# Patient Record
Sex: Male | Born: 1960 | Race: White | Hispanic: No | Marital: Single | Smoking: Never smoker
Health system: Southern US, Community
[De-identification: ages and names within clinical notes are randomized; demographics above are authoritative.]

## PROBLEM LIST (undated history)

## (undated) DIAGNOSIS — K219 Gastro-esophageal reflux disease without esophagitis: Secondary | ICD-10-CM

## (undated) DIAGNOSIS — F419 Anxiety disorder, unspecified: Secondary | ICD-10-CM

## (undated) DIAGNOSIS — F32A Depression, unspecified: Secondary | ICD-10-CM

## (undated) DIAGNOSIS — I4510 Unspecified right bundle-branch block: Secondary | ICD-10-CM

## (undated) DIAGNOSIS — I10 Essential (primary) hypertension: Secondary | ICD-10-CM

---

## 2011-11-28 ENCOUNTER — Inpatient Hospital Stay (HOSPITAL_COMMUNITY)
Admission: EM | Admit: 2011-11-28 | Discharge: 2011-12-02 | DRG: 439 | Disposition: A | Discharge status: Nursing Home (Includes ICF) | Payer: Self-pay | Attending: Family Medicine | Admitting: Family Medicine

## 2011-11-28 DIAGNOSIS — K859 Acute pancreatitis without necrosis or infection, unspecified: Principal | ICD-10-CM | POA: Diagnosis present

## 2011-11-28 DIAGNOSIS — Z6841 Body Mass Index (BMI) 40.0 and over, adult: Secondary | ICD-10-CM

## 2011-11-28 DIAGNOSIS — D72829 Elevated white blood cell count, unspecified: Secondary | ICD-10-CM | POA: Diagnosis present

## 2011-11-28 DIAGNOSIS — F411 Generalized anxiety disorder: Secondary | ICD-10-CM | POA: Diagnosis present

## 2011-11-28 DIAGNOSIS — E669 Obesity, unspecified: Secondary | ICD-10-CM | POA: Diagnosis present

## 2011-11-28 HISTORY — DX: Anxiety disorder, unspecified: F41.9

## 2011-11-28 HISTORY — DX: Gastro-esophageal reflux disease without esophagitis: K21.9

## 2011-11-28 MED ORDER — MORPHINE SULFATE 4 MG/ML IJ SOLN
4.0000 mg | Freq: Once | INTRAMUSCULAR | Status: AC
  Administered 2011-11-28: 4 mg via INTRAVENOUS
  Filled 2011-11-28: qty 1

## 2011-11-28 MED ORDER — SODIUM CHLORIDE 0.9 % IV BOLUS (SEPSIS)
1000.0000 mL | Freq: Once | INTRAVENOUS | Status: AC
  Administered 2011-11-28: 1000 mL via INTRAVENOUS

## 2011-11-28 MED ORDER — ONDANSETRON HCL 4 MG/2ML IJ SOLN
4.0000 mg | Freq: Once | INTRAMUSCULAR | Status: AC
  Administered 2011-11-28: 4 mg via INTRAVENOUS
  Filled 2011-11-28: qty 2

## 2011-11-28 NOTE — ED Notes (Signed)
Bed:WHALA<BR> Expected date:11/28/11<BR> Expected time:<BR> Means of arrival:<BR> Comments:<BR> EMS 32 Ptar - abd pain

## 2011-11-28 NOTE — ED Notes (Signed)
Pt c/o abd pain. Pt states pain is worse upon eating. Pt also c/o of some diaphoresis.

## 2011-11-28 NOTE — ED Provider Notes (Signed)
History     CSN: [id number]  Arrival date & time 11/28/11  2313   First MD Initiated Contact with Patient 11/28/11 2329      Chief Complaint  Patient presents with  . Abdominal Pain    HPI  History provided by the patient. Patient is a 51 year old male with no significant past medical history who presents with complaints of lower abdominal pains for the past 2 days. Patient reports the pain came on acutely and has been persistent. Pain occasionally waxes and wanes. Pain is worse with some movements and worse after eating. Pain does not radiate. Patient has not taken anything for the pain symptoms. He denies any other aggravating or alleviating factors. Patient denies any other associated symptoms. He denies fever, chills, nausea, vomiting, diarrhea or constipation.    Past Medical History  Diagnosis Date  . Anxiety    Patient sees Dr. Redmond School in [place]   No past surgical history on file.  No family history on file.  History  Substance Use Topics  . Smoking status: Never Smoker   . Smokeless tobacco: Not on file  . Alcohol Use: No      Review of Systems  Constitutional: Positive for diaphoresis and appetite change. Negative for fever and chills.  Respiratory: Negative for cough and shortness of breath.   Cardiovascular: Negative for chest pain.  Gastrointestinal: Positive for abdominal pain. Negative for nausea, vomiting, diarrhea and constipation.  Genitourinary: Negative for dysuria, frequency, hematuria and flank pain.    Allergies  Review of patient's allergies indicates not on file.  Home Medications  No current outpatient prescriptions on file.  There were no vitals taken for this visit.  Physical Exam  Nursing note and vitals reviewed. Constitutional: He is oriented to person, place, and time. He appears well-developed and well-nourished.  HENT:  Head: Normocephalic.  Cardiovascular: Normal rate and regular rhythm.   Pulmonary/Chest: Effort  normal and breath sounds normal.  Abdominal: Soft. He exhibits distension. There is tenderness in the right lower quadrant, suprapubic area and left lower quadrant. There is no rebound, no guarding, no tenderness at McBurney's point and negative Murphy's sign.       Obese. Diffuse lower abdominal tenderness.  Neurological: He is alert and oriented to person, place, and time.  Skin: Skin is warm.  Psychiatric: He has a normal mood and affect. His behavior is normal.    ED Course  Procedures   Results for orders placed during the hospital encounter of 11/28/11  CBC      Component Value Range   WBC 10.0  4.0 - 10.5 (K/uL)   RBC 5.47  4.22 - 5.81 (MIL/uL)   Hemoglobin 16.3  13.0 - 17.0 (g/dL)   HCT 40.9  81.1 - 91.4 (%)   MCV 90.3  78.0 - 100.0 (fL)   MCH 29.8  26.0 - 34.0 (pg)   MCHC 33.0  30.0 - 36.0 (g/dL)   RDW 78.2  95.6 - 21.3 (%)   Platelets 158  150 - 400 (K/uL)  DIFFERENTIAL      Component Value Range   Neutrophils Relative 76  43 - 77 (%)   Lymphocytes Relative 12  12 - 46 (%)   Monocytes Relative 10  3 - 12 (%)   Eosinophils Relative 2  0 - 5 (%)   Basophils Relative 0  0 - 1 (%)   Neutro Abs 7.6  1.7 - 7.7 (K/uL)   Lymphs Abs 1.2  0.7 - 4.0 (K/uL)  Monocytes Absolute 1.0  0.1 - 1.0 (K/uL)   Eosinophils Absolute 0.2  0.0 - 0.7 (K/uL)   Basophils Absolute 0.0  0.0 - 0.1 (K/uL)   Smear Review MORPHOLOGY UNREMARKABLE    COMPREHENSIVE METABOLIC PANEL      Component Value Range   Sodium 140  135 - 145 (mEq/L)   Potassium 3.9  3.5 - 5.1 (mEq/L)   Chloride 106  96 - 112 (mEq/L)   CO2 19  19 - 32 (mEq/L)   Glucose, Bld 129 (*) 70 - 99 (mg/dL)   BUN 16  6 - 23 (mg/dL)   Creatinine, Ser 1.61  0.50 - 1.35 (mg/dL)   Calcium 9.3  8.4 - 09.6 (mg/dL)   Total Protein 6.8  6.0 - 8.3 (g/dL)   Albumin 3.7  3.5 - 5.2 (g/dL)   AST 045 (*) 0 - 37 (U/L)   ALT 145 (*) 0 - 53 (U/L)   Alkaline Phosphatase 116  39 - 117 (U/L)   Total Bilirubin 0.5  0.3 - 1.2 (mg/dL)   GFR calc non Af  Amer >90  >90 (mL/min)   GFR calc Af Amer >90  >90 (mL/min)  LIPASE, BLOOD      Component Value Range   Lipase >3000 (*) 11 - 59 (U/L)       Ct Abdomen Pelvis W Contrast  11/29/2011  *RADIOLOGY REPORT*  Clinical Data: Mid abdominal pain.  Elevated lipase.  White cell count 10.  CT ABDOMEN AND PELVIS WITH CONTRAST  Technique:  Multidetector CT imaging of the abdomen and pelvis was performed following the standard protocol during bolus administration of intravenous contrast.  Contrast: OMNIPAQUE IOHEXOL 300 MG/ML  SOLN  Comparison: None.  Findings: Atelectasis in the lung bases.  Diffuse low attenuation change throughout the liver consistent with diffuse fatty infiltration.  Spleen size is normal.  Gallbladder and bile ducts are normal.  There is infiltration of the peripancreatic fat with fluid layering in the pericolic gutters bilaterally.  Changes are consistent with pancreatitis.  Normal homogeneous pancreatic parenchymal enhancement.  No evidence of focal pancreatic necrosis.  No peripancreatic abscess.  Adrenal glands and kidneys are unremarkable.  Normal caliber abdominal aorta.  No retroperitoneal lymphadenopathy.  The stomach and small bowel are not abnormally distended.  Stool filled colon without distension or wall thickening.  Prominent visceral adipose tissues.  Pelvis: The appendix is normal.  Prostate gland is not enlarged. Bladder wall is not thickened.  No free or loculated pelvic fluid collections.  Diverticula in the sigmoid colon without diverticulitis.  Normal alignment of the lumbar spine.  Hemangioma in L2.  IMPRESSION: Inflammatory infiltration and fluid around the pancreas consistent with pancreatitis.  No evidence of pancreatic necrosis, abscess, or stenosis.  Original Report Authenticated By: Marlon Pel, M.D.     1. Pancreatitis       MDM  Patient seen and evaluated. Patient no acute distress. Patient with slight diaphoresis.   Patient having good  improvement of pain at this time. Lipase is elevated greater than 3000 concerning for pancreatitis. CTs pending.   CT shows signs consistent with uncomplicated pancreatitis. There is no signs for cholecystitis or choledocholithiasis.  Patient discussed with attending physician. Patient with new-onset pancreatitis without specific cause. Patient still with some discomfort. At this time felt patient would benefit most from admission.   Spoke with Dr. Orvan Falconer with [facility]. He will see patient. He would like a lipid panel and abdominal ultrasound ordered. Patient will be admitted to  team [address] [place], [place] 11/29/11 [phone]

## 2011-11-29 ENCOUNTER — Encounter (HOSPITAL_COMMUNITY): Payer: Self-pay | Admitting: Emergency Medicine

## 2011-11-29 ENCOUNTER — Inpatient Hospital Stay (HOSPITAL_COMMUNITY): Payer: Self-pay

## 2011-11-29 ENCOUNTER — Emergency Department (HOSPITAL_COMMUNITY): Payer: Self-pay

## 2011-11-29 DIAGNOSIS — F411 Generalized anxiety disorder: Secondary | ICD-10-CM | POA: Diagnosis present

## 2011-11-29 DIAGNOSIS — K859 Acute pancreatitis without necrosis or infection, unspecified: Principal | ICD-10-CM | POA: Diagnosis present

## 2011-11-29 LAB — CBC
Hemoglobin: 16.3 g/dL (ref 13.0–17.0)
MCH: 29.8 pg (ref 26.0–34.0)
MCHC: 33 g/dL (ref 30.0–36.0)
RDW: 14.3 % (ref 11.5–15.5)

## 2011-11-29 LAB — DIFFERENTIAL
Basophils Absolute: 0 10*3/uL (ref 0.0–0.1)
Basophils Relative: 0 % (ref 0–1)
Eosinophils Absolute: 0.2 10*3/uL (ref 0.0–0.7)
Monocytes Absolute: 1 10*3/uL (ref 0.1–1.0)
Neutro Abs: 7.6 10*3/uL (ref 1.7–7.7)
Neutrophils Relative %: 76 % (ref 43–77)

## 2011-11-29 LAB — LIPID PANEL
HDL: 44 mg/dL (ref >39)
Triglycerides: 67 mg/dL (ref <150)

## 2011-11-29 LAB — COMPREHENSIVE METABOLIC PANEL
BUN: 16 mg/dL (ref 6–23)
Calcium: 9.3 mg/dL (ref 8.4–10.5)
GFR calc Af Amer: >90 mL/min (ref >90)
Glucose, Bld: 129 mg/dL — ABNORMAL HIGH (ref 70–99)
Sodium: 140 mEq/L (ref 135–145)
Total Protein: 6.8 g/dL (ref 6.0–8.3)

## 2011-11-29 LAB — LIPASE, BLOOD: Lipase: >3000 U/L — ABNORMAL HIGH (ref 11–59)

## 2011-11-29 MED ORDER — ONDANSETRON HCL 4 MG/2ML IJ SOLN: 4.0000 mg | Freq: Three times a day (TID) | INTRAMUSCULAR | Status: AC | PRN

## 2011-11-29 MED ORDER — ONDANSETRON HCL 4 MG/2ML IJ SOLN
4.0000 mg | Freq: Once | INTRAMUSCULAR | Status: AC
  Administered 2011-11-29: 4 mg via INTRAVENOUS
  Filled 2011-11-29: qty 2

## 2011-11-29 MED ORDER — HYDROMORPHONE HCL PF 1 MG/ML IJ SOLN: 1.0000 mg | INTRAMUSCULAR | Status: DC | PRN

## 2011-11-29 MED ORDER — LORAZEPAM 0.5 MG PO TABS
0.5000 mg | ORAL_TABLET | Freq: Every day | ORAL | Status: DC | PRN
  Administered 2011-11-30 – 2011-12-02 (×2): 0.5 mg via ORAL
  Filled 2011-11-29 (×3): qty 1

## 2011-11-29 MED ORDER — SODIUM CHLORIDE 0.9 % IJ SOLN: 9.0000 mL | INTRAMUSCULAR | Status: DC | PRN

## 2011-11-29 MED ORDER — ONDANSETRON HCL 4 MG/2ML IJ SOLN: 4.0000 mg | Freq: Four times a day (QID) | INTRAMUSCULAR | Status: DC | PRN

## 2011-11-29 MED ORDER — SODIUM CHLORIDE 0.9 % IV BOLUS (SEPSIS)
1000.0000 mL | Freq: Once | INTRAVENOUS | Status: AC
  Administered 2011-11-29: 1000 mL via INTRAVENOUS

## 2011-11-29 MED ORDER — MORPHINE SULFATE 4 MG/ML IJ SOLN
4.0000 mg | Freq: Once | INTRAMUSCULAR | Status: AC
  Administered 2011-11-29: 4 mg via INTRAVENOUS
  Filled 2011-11-29: qty 1

## 2011-11-29 MED ORDER — IOHEXOL 300 MG/ML  SOLN
100.0000 mL | Freq: Once | INTRAMUSCULAR | Status: AC | PRN
  Administered 2011-11-29: 100 mL via INTRAVENOUS

## 2011-11-29 MED ORDER — DIPHENHYDRAMINE HCL 50 MG/ML IJ SOLN: 12.5000 mg | Freq: Four times a day (QID) | INTRAMUSCULAR | Status: DC | PRN

## 2011-11-29 MED ORDER — ONDANSETRON HCL 4 MG PO TABS: 4.0000 mg | ORAL_TABLET | Freq: Four times a day (QID) | ORAL | Status: DC | PRN

## 2011-11-29 MED ORDER — SODIUM CHLORIDE 0.9 % IV SOLN
INTRAVENOUS | Status: AC
  Administered 2011-11-29: 05:00:00 via INTRAVENOUS

## 2011-11-29 MED ORDER — HYDROCODONE-ACETAMINOPHEN 5-325 MG PO TABS
1.0000 | ORAL_TABLET | ORAL | Status: DC | PRN
  Administered 2011-11-29 – 2011-12-01 (×4): 1 via ORAL
  Filled 2011-11-29 (×4): qty 1

## 2011-11-29 MED ORDER — HEPARIN SODIUM (PORCINE) 5000 UNIT/ML IJ SOLN
5000.0000 [IU] | Freq: Three times a day (TID) | INTRAMUSCULAR | Status: DC
  Administered 2011-11-29 – 2011-12-02 (×8): 5000 [IU] via SUBCUTANEOUS
  Filled 2011-11-29 (×12): qty 1

## 2011-11-29 MED ORDER — ALPRAZOLAM 1 MG PO TABS: 5.0000 mg | ORAL_TABLET | Freq: Every day | ORAL | Status: DC

## 2011-11-29 MED ORDER — NALOXONE HCL 0.4 MG/ML IJ SOLN: 0.4000 mg | INTRAMUSCULAR | Status: DC | PRN

## 2011-11-29 MED ORDER — HYDROMORPHONE HCL PF 1 MG/ML IJ SOLN
1.0000 mg | INTRAMUSCULAR | Status: DC | PRN
  Administered 2011-12-01: 1 mg via INTRAVENOUS
  Filled 2011-11-29: qty 1
  Filled 2011-11-29: qty 2

## 2011-11-29 MED ORDER — KCL IN DEXTROSE-NACL 20-5-0.45 MEQ/L-%-% IV SOLN
INTRAVENOUS | Status: DC
  Administered 2011-11-29 – 2011-12-01 (×6): via INTRAVENOUS
  Filled 2011-11-29 (×7): qty 1000

## 2011-11-29 MED ORDER — DIPHENHYDRAMINE HCL 12.5 MG/5ML PO ELIX: 12.5000 mg | ORAL_SOLUTION | Freq: Four times a day (QID) | ORAL | Status: DC | PRN

## 2011-11-29 NOTE — ED Notes (Signed)
Called to give report nurse unavailable.

## 2011-11-29 NOTE — ED Provider Notes (Signed)
Medical screening examination/treatment/procedure(s) were performed by non-physician practitioner and as supervising physician I was immediately available for consultation/collaboration.  Makyia Erxleben, MD 11/29/11 2300 

## 2011-11-29 NOTE — H&P (Signed)
PCP:  Patient mentions Dr. Redmond School   Chief Complaint:  Acute abdominal discomfort  HPI: Patient is a 51 y/o CM with PMH of anxiety that presented with Abdominal discomfort to the ED. Pain started two days ago.  Was described as an achy pain that did not radiate and was located at the upper portion of his abdomen.  Made worse with po intake.  Nothing that the patient is aware of made it better while he was home.  Denies any fever, chills, emesis, or BRBPR.  Brother is care giver and reports that patient resides at an ALF.  Reportedly patient developed high anxiety after parents who were initial caregivers were not able to care for him further.    While in the ED patient was given morphine, anti emetic (zofran), and CT of abdomen was obtained after patient was found to have an elevated lipase of 3000.  CT of abdomen interpreted as inflammatory infiltration and fluid around pancrease consistent with pancreatitis.  No evidence of pancreatic necrosis, abscess, or stenosis.    U/S of abdomen was subsequently ordered and interpreted as fatty infiltration of the liver with gallstones.  Allergies:   Allergies  Allergen Reactions  . Sulfa Antibiotics Nausea And Vomiting      Past Medical History  Diagnosis Date  . Anxiety     No past surgical history on file.  Prior to Admission medications   Medication Sig Start Date End Date Taking? Authorizing Provider  ALPRAZolam Prudy Feeler) 1 MG tablet Take 5 mg by mouth daily.   Yes Historical Provider, MD  cholecalciferol (VITAMIN D) 1000 UNITS tablet Take 1,000 Units by mouth daily.   Yes Historical Provider, MD  omeprazole (PRILOSEC) 20 MG capsule Take 20 mg by mouth daily.   Yes Historical Provider, MD    Social History:  reports that he has never smoked. He does not have any smokeless tobacco history on file. He reports that he does not drink alcohol. His drug history not on file.  No family history on file.  Review of Systems:  Constitutional:  Denies fever, chills, diaphoresis, appetite change and fatigue.  HEENT: Denies photophobia, eye pain, redness, hearing loss, ear pain, congestion, sore throat, rhinorrhea, sneezing, mouth sores, trouble swallowing, neck pain, neck stiffness and tinnitus.   Respiratory: Denies SOB, DOE, cough, chest tightness,  and wheezing.   Cardiovascular: Denies chest pain, palpitations and leg swelling.  Gastrointestinal: Denies nausea, vomiting, + abdominal pain, denies diarrhea, constipation, blood in stool and abdominal distention.  Genitourinary: Denies dysuria, urgency, frequency, hematuria, flank pain and difficulty urinating.  Musculoskeletal: Denies myalgias, back pain, joint swelling, arthralgias and gait problem.  Skin: Denies pallor, rash and wound.  Neurological: Denies dizziness, seizures, syncope, weakness, light-headedness, numbness and headaches.  Hematological: Denies adenopathy. Easy bruising, personal or family bleeding history  Psychiatric/Behavioral: Denies suicidal ideation, mood changes, confusion, nervousness, sleep disturbance and agitation   Physical Exam: Blood pressure 125/84, pulse 90, temperature 98.3 F (36.8 C), temperature source Oral, resp. rate 20, height 6' (1.829 m), weight 135.4 kg (298 lb 8.1 oz), SpO2 98.00%. General: Alert, awake, oriented x3, in no acute distress. HEENT: No bruits, no goiter. Heart: Regular rate and rhythm, without murmurs, rubs, gallops. Lungs: Clear to auscultation bilaterally. Abdomen: Soft, + epigastric tenderness with no rebound tenderness or guarding, nondistended, positive bowel sounds. Extremities: No clubbing cyanosis or edema with positive pedal pulses. Neuro: Grossly intact, nonfocal.    Labs on Admission:  Results for orders placed during the hospital encounter of  11/28/11 (from the past 48 hour(s))  CBC     Status: Normal   Collection Time   11/29/11 12:04 AM      Component Value Range Comment   WBC 10.0  4.0 - 10.5 (K/uL)     RBC 5.47  4.22 - 5.81 (MIL/uL)    Hemoglobin 16.3  13.0 - 17.0 (g/dL)    HCT 16.1  09.6 - 04.5 (%)    MCV 90.3  78.0 - 100.0 (fL)    MCH 29.8  26.0 - 34.0 (pg)    MCHC 33.0  30.0 - 36.0 (g/dL)    RDW 40.9  81.1 - 91.4 (%)    Platelets 158  150 - 400 (K/uL) PLATELET COUNT CONFIRMED BY SMEAR  DIFFERENTIAL     Status: Normal   Collection Time   11/29/11 12:04 AM      Component Value Range Comment   Neutrophils Relative 76  43 - 77 (%)    Lymphocytes Relative 12  12 - 46 (%)    Monocytes Relative 10  3 - 12 (%)    Eosinophils Relative 2  0 - 5 (%)    Basophils Relative 0  0 - 1 (%)    Neutro Abs 7.6  1.7 - 7.7 (K/uL)    Lymphs Abs 1.2  0.7 - 4.0 (K/uL)    Monocytes Absolute 1.0  0.1 - 1.0 (K/uL)    Eosinophils Absolute 0.2  0.0 - 0.7 (K/uL)    Basophils Absolute 0.0  0.0 - 0.1 (K/uL)    Smear Review MORPHOLOGY UNREMARKABLE     COMPREHENSIVE METABOLIC PANEL     Status: Abnormal   Collection Time   11/29/11 12:04 AM      Component Value Range Comment   Sodium 140  135 - 145 (mEq/L)    Potassium 3.9  3.5 - 5.1 (mEq/L)    Chloride 106  96 - 112 (mEq/L)    CO2 19  19 - 32 (mEq/L)    Glucose, Bld 129 (*) 70 - 99 (mg/dL)    BUN 16  6 - 23 (mg/dL)    Creatinine, Ser 7.82  0.50 - 1.35 (mg/dL)    Calcium 9.3  8.4 - 10.5 (mg/dL)    Total Protein 6.8  6.0 - 8.3 (g/dL)    Albumin 3.7  3.5 - 5.2 (g/dL)    AST 956 (*) 0 - 37 (U/L)    ALT 145 (*) 0 - 53 (U/L)    Alkaline Phosphatase 116  39 - 117 (U/L)    Total Bilirubin 0.5  0.3 - 1.2 (mg/dL)    GFR calc non Af Amer >90  >90 (mL/min)    GFR calc Af Amer >90  >90 (mL/min)   LIPASE, BLOOD     Status: Abnormal   Collection Time   11/29/11 12:04 AM      Component Value Range Comment   Lipase >3000 (*) 11 - 59 (U/L) REPEATED TO VERIFY    Radiological Exams on Admission: US Abdomen Complete  11/29/2011  *RADIOLOGY REPORT*  Clinical Data:  Evaluate for gallstones  ABDOMINAL ULTRASOUND COMPLETE  Comparison:  None.  Findings:  Gallbladder:   Multiple small stones are noted within the lumen of the gallbladder which measure up to 5 mm.  No gallbladder wall thickening identified.  Negative sonographic Murphy's sign.  Common Bile Duct:  Within normal limits in caliber.  Liver: No focal mass lesion identified.  The liver is diffusely echogenic consistent with fatty infiltration.  IVC:  Appears normal.  Pancreas:  No abnormality identified.  Spleen:  Within normal limits in size and echotexture.  Right kidney:  Normal in size and parenchymal echogenicity.  No evidence of mass or hydronephrosis.  Left kidney:  Normal in size and parenchymal echogenicity.  No evidence of mass or hydronephrosis.  Abdominal Aorta:  No aneurysm identified.  IMPRESSION:  1.  Fatty infiltration of the liver. 2.  Gallstones.  Original Report Authenticated By: Rosealee Albee, M.D.   Ct Abdomen Pelvis W Contrast  11/29/2011  *RADIOLOGY REPORT*  Clinical Data: Mid abdominal pain.  Elevated lipase.  White cell count 10.  CT ABDOMEN AND PELVIS WITH CONTRAST  Technique:  Multidetector CT imaging of the abdomen and pelvis was performed following the standard protocol during bolus administration of intravenous contrast.  Contrast: OMNIPAQUE IOHEXOL 300 MG/ML  SOLN  Comparison: None.  Findings: Atelectasis in the lung bases.  Diffuse low attenuation change throughout the liver consistent with diffuse fatty infiltration.  Spleen size is normal.  Gallbladder and bile ducts are normal.  There is infiltration of the peripancreatic fat with fluid layering in the pericolic gutters bilaterally.  Changes are consistent with pancreatitis.  Normal homogeneous pancreatic parenchymal enhancement.  No evidence of focal pancreatic necrosis.  No peripancreatic abscess.  Adrenal glands and kidneys are unremarkable.  Normal caliber abdominal aorta.  No retroperitoneal lymphadenopathy.  The stomach and small bowel are not abnormally distended.  Stool filled colon without distension or wall  thickening.  Prominent visceral adipose tissues.  Pelvis: The appendix is normal.  Prostate gland is not enlarged. Bladder wall is not thickened.  No free or loculated pelvic fluid collections.  Diverticula in the sigmoid colon without diverticulitis.  Normal alignment of the lumbar spine.  Hemangioma in L2.  IMPRESSION: Inflammatory infiltration and fluid around the pancreas consistent with pancreatitis.  No evidence of pancreatic necrosis, abscess, or stenosis.  Original Report Authenticated By: Marlon Pel, M.D.    Assessment/Plan Active Problems:  1) Pancreatitis:  At this point suspect patient may have gall stone pancreatitis given the results from his recent abdominal ultrasound.  Other possible etiologies are secondary to elevated triglycerides or viral infection.   -Place NPO -MIVF's -monitor and trend Lipase levels -first episode reportedly thus will hold off on consulting general surgery at this juncture and will await pending lab results.  2) GAD:  Will continue home regimen at this juncture.  Time Spent on Admission: 45 minutes placing orders, documenting, medical decision making, obtaining history, physical exam  [name] [facility] Pager: [phone] 11/29/2011, 9:10 AM

## 2011-11-29 NOTE — Progress Notes (Signed)
CARE MANAGEMENT NOTE 11/29/2011  Patient:  Jeremy Wu, Jeremy Wu   Account Number:  [id number]  Date Initiated:  11/29/2011  Documentation initiated by:  Elijah Michaelis  Subjective/Objective Assessment:   pt with confirmed pancreatitis by imaging     Action/Plan:   lives at home   Anticipated DC Date:  12/02/2011   Anticipated DC Plan:  HOME/SELF CARE  In-house referral  Financial Counselor         Choice offered to / List presented to:             Status of service:  In process, will continue to follow Medicare Important Message given?   (If response is "NO", the following Medicare IM given date fields will be blank) Date Medicare IM given:   Date Additional Medicare IM given:    Discharge Disposition:    Per UR Regulation:  Reviewed for med. necessity/level of care/duration of stay  If discussed at Long Length of Stay Meetings, dates discussed:    Comments:  [id number]/Laxmi Choung Earlene Plater, RN, BSN, CCM No discharge needs present at time of this review. Case Management [id number]

## 2011-11-30 DIAGNOSIS — F411 Generalized anxiety disorder: Secondary | ICD-10-CM

## 2011-11-30 DIAGNOSIS — K859 Acute pancreatitis without necrosis or infection, unspecified: Secondary | ICD-10-CM

## 2011-11-30 LAB — COMPREHENSIVE METABOLIC PANEL
Albumin: 3.2 g/dL — ABNORMAL LOW (ref 3.5–5.2)
Alkaline Phosphatase: 82 U/L (ref 39–117)
BUN: 11 mg/dL (ref 6–23)
CO2: 24 mEq/L (ref 19–32)
Chloride: 102 mEq/L (ref 96–112)
GFR calc non Af Amer: >90 mL/min (ref >90)
Glucose, Bld: 142 mg/dL — ABNORMAL HIGH (ref 70–99)
Potassium: 4.1 mEq/L (ref 3.5–5.1)
Total Bilirubin: 0.7 mg/dL (ref 0.3–1.2)

## 2011-11-30 LAB — CBC
MCH: 30.1 pg (ref 26.0–34.0)
MCV: 88.2 fL (ref 78.0–100.0)
Platelets: 171 10*3/uL (ref 150–400)
RDW: 14.4 % (ref 11.5–15.5)
WBC: 13.3 10*3/uL — ABNORMAL HIGH (ref 4.0–10.5)

## 2011-11-30 NOTE — Progress Notes (Signed)
Subjective: Patient mentions that he feels better today.  Still having some abdominal discomfort.  No nausea reported today.  Denies any fever, chills, emesis, HA, blurred vision.  Objective: Filed Vitals:   11/29/11 0618 11/29/11 1420 11/29/11 2007 11/30/11 0506  BP: 125/84 135/77 134/72 129/77  Pulse: 90 107 114 109  Temp: 98.3 F (36.8 C) 99.8 F (37.7 C) 100 F (37.8 C) 100.4 F (38 C)  TempSrc: Oral Oral Oral Oral  Resp: 20 19 20 18   Height: 6' (1.829 m)     Weight: 135.4 kg (298 lb 8.1 oz)     SpO2: 98% 96% 95% 96%   Weight change:  No intake or output data in the 24 hours ending 11/30/11 1019  Blood pressure 125/84, pulse 90, temperature 98.3 F (36.8 C), temperature source Oral, resp. rate 20, height 6' (1.829 m), weight 135.4 kg (298 lb 8.1 oz), SpO2 98.00%.  General: Alert, awake, oriented x3, in no acute distress.  HEENT: No bruits, no goiter.  Heart: Regular rate and rhythm, without murmurs, rubs, gallops.  Lungs: Clear to auscultation bilaterally.  Abdomen: Soft, + epigastric tenderness with no rebound tenderness or guarding, nondistended, positive bowel sounds.  Extremities: No clubbing cyanosis or edema with positive pedal pulses.  Neuro: Grossly intact, nonfocal.   Lab Results:  [facility] 11/30/11 0340 11/29/11 0004  NA 136 140  K 4.1 3.9  CL 102 106  CO2 24 19  GLUCOSE 142* 129*  BUN 11 16  CREATININE 0.76 0.79  CALCIUM 8.1* 9.3  MG -- --  PHOS -- --    Basename 11/30/11 0340 11/29/11 0004  AST 49* 131*  ALT 83* 145*  ALKPHOS 82 116  BILITOT 0.7 0.5  PROT 6.1 6.8  ALBUMIN 3.2* 3.7    Basename 11/30/11 0340 11/29/11 0004  LIPASE 560* >3000*  AMYLASE -- --    Basename 11/30/11 0340 11/29/11 0004  WBC 13.3* 10.0  NEUTROABS -- 7.6  HGB 13.8 16.3  HCT 40.5 49.4  MCV 88.2 90.3  PLT 171 158   No results found for this basename: CKTOTAL:3,CKMB:3,CKMBINDEX:3,TROPONINI:3 in the last 72 hours No components found with this basename:  POCBNP:3 No results found for this basename: DDIMER:2 in the last 72 hours No results found for this basename: HGBA1C:2 in the last 72 hours  Basename 11/29/11 0520  CHOL 183  HDL 44  LDLCALC 126*  TRIG 67  CHOLHDL 4.2  LDLDIRECT --   No results found for this basename: TSH,T4TOTAL,FREET3,T3FREE,THYROIDAB in the last 72 hours No results found for this basename: VITAMINB12:2,FOLATE:2,FERRITIN:2,TIBC:2,IRON:2,RETICCTPCT:2 in the last 72 hours  Micro Results: No results found for this or any previous visit (from the past 240 hour(s)).  Studies/Results: US Abdomen Complete  11/29/2011  *RADIOLOGY REPORT*  Clinical Data:  Evaluate for gallstones  ABDOMINAL ULTRASOUND COMPLETE  Comparison:  None.  Findings:  Gallbladder:  Multiple small stones are noted within the lumen of the gallbladder which measure up to 5 mm.  No gallbladder wall thickening identified.  Negative sonographic Murphy's sign.  Common Bile Duct:  Within normal limits in caliber.  Liver: No focal mass lesion identified.  The liver is diffusely echogenic consistent with fatty infiltration.  IVC:  Appears normal.  Pancreas:  No abnormality identified.  Spleen:  Within normal limits in size and echotexture.  Right kidney:  Normal in size and parenchymal echogenicity.  No evidence of mass or hydronephrosis.  Left kidney:  Normal in size and parenchymal echogenicity.  No evidence of mass or  hydronephrosis.  Abdominal Aorta:  No aneurysm identified.  IMPRESSION:  1.  Fatty infiltration of the liver. 2.  Gallstones.  Original Report Authenticated By: Rosealee Albee, M.D.   Ct Abdomen Pelvis W Contrast  11/29/2011  *RADIOLOGY REPORT*  Clinical Data: Mid abdominal pain.  Elevated lipase.  White cell count 10.  CT ABDOMEN AND PELVIS WITH CONTRAST  Technique:  Multidetector CT imaging of the abdomen and pelvis was performed following the standard protocol during bolus administration of intravenous contrast.  Contrast: OMNIPAQUE IOHEXOL  300 MG/ML  SOLN  Comparison: None.  Findings: Atelectasis in the lung bases.  Diffuse low attenuation change throughout the liver consistent with diffuse fatty infiltration.  Spleen size is normal.  Gallbladder and bile ducts are normal.  There is infiltration of the peripancreatic fat with fluid layering in the pericolic gutters bilaterally.  Changes are consistent with pancreatitis.  Normal homogeneous pancreatic parenchymal enhancement.  No evidence of focal pancreatic necrosis.  No peripancreatic abscess.  Adrenal glands and kidneys are unremarkable.  Normal caliber abdominal aorta.  No retroperitoneal lymphadenopathy.  The stomach and small bowel are not abnormally distended.  Stool filled colon without distension or wall thickening.  Prominent visceral adipose tissues.  Pelvis: The appendix is normal.  Prostate gland is not enlarged. Bladder wall is not thickened.  No free or loculated pelvic fluid collections.  Diverticula in the sigmoid colon without diverticulitis.  Normal alignment of the lumbar spine.  Hemangioma in L2.  IMPRESSION: Inflammatory infiltration and fluid around the pancreas consistent with pancreatitis.  No evidence of pancreatic necrosis, abscess, or stenosis.  Original Report Authenticated By: Marlon Pel, M.D.    Medications: I have reviewed the patient's current medications.  Active Problems:  1) Pancreatitis: At this point suspect patient may have gall stone pancreatitis given the results from his recent abdominal ultrasound. His triglyceride level was 67. -advance diet as tolerated.  Will start with clear liquid diet. -MIVF's and will plan on decreasing as his oral intake improves. -monitor and trend Lipase levels  -first episode reportedly thus will hold off on consulting general surgery.  Have discussed with caregiver and have recommended that if patient continues to have pancreatitis patient may consider elective cholecystectomy.     2) GAD: Will continue home  regimen at this juncture.     LOS: 2 days   [name] M.D.  [facility] 11/30/2011, 10:19 AM

## 2011-12-01 LAB — URINALYSIS, ROUTINE W REFLEX MICROSCOPIC
Bilirubin Urine: NEGATIVE
Glucose, UA: 100 mg/dL — AB
Hgb urine dipstick: NEGATIVE
Specific Gravity, Urine: 1.028 (ref 1.005–1.030)
Urobilinogen, UA: 2 mg/dL — ABNORMAL HIGH (ref 0.0–1.0)

## 2011-12-01 LAB — CBC
HCT: 41.1 % (ref 39.0–52.0)
MCHC: 34.1 g/dL (ref 30.0–36.0)
MCV: 87.4 fL (ref 78.0–100.0)
RDW: 13.9 % (ref 11.5–15.5)

## 2011-12-01 LAB — BASIC METABOLIC PANEL
BUN: 9 mg/dL (ref 6–23)
Chloride: 99 mEq/L (ref 96–112)
Creatinine, Ser: 0.78 mg/dL (ref 0.50–1.35)
GFR calc Af Amer: >90 mL/min (ref >90)
GFR calc non Af Amer: >90 mL/min (ref >90)

## 2011-12-01 LAB — URINE MICROSCOPIC-ADD ON

## 2011-12-01 MED ORDER — LEVOFLOXACIN 500 MG PO TABS
500.0000 mg | ORAL_TABLET | Freq: Every day | ORAL | Status: DC
  Administered 2011-12-01 – 2011-12-02 (×2): 500 mg via ORAL
  Filled 2011-12-01 (×2): qty 1

## 2011-12-01 MED ORDER — HYDROCODONE-ACETAMINOPHEN 5-325 MG PO TABS
1.0000 | ORAL_TABLET | ORAL | Status: DC | PRN
  Administered 2011-12-01: 2 via ORAL
  Administered 2011-12-02: 1 via ORAL
  Administered 2011-12-02: 2 via ORAL
  Filled 2011-12-01 (×2): qty 2
  Filled 2011-12-01: qty 1

## 2011-12-01 MED ORDER — SENNA 8.6 MG PO TABS
2.0000 | ORAL_TABLET | Freq: Every evening | ORAL | Status: DC | PRN
  Administered 2011-12-01: 17.2 mg via ORAL
  Filled 2011-12-01: qty 2

## 2011-12-01 NOTE — Progress Notes (Signed)
Subjective: Patient feels better today mentions that he is tolerating his diet well.  Complaining of back pain this afternoon.  Was on a clear liquid diet.  Denies any current abdominal discomfort.  No acute issues reported overnight.  Denies any HA's, nausea, emesis, fever, chills, dysuria.  Objective: Filed Vitals:   11/30/11 0506 11/30/11 1410 11/30/11 2003 12/01/11 0541  BP: 129/77 148/82 147/76 154/88  Pulse: 109 106 107 71  Temp: 100.4 F (38 C) 98.8 F (37.1 C) 100.1 F (37.8 C) 99.1 F (37.3 C)  TempSrc: Oral Oral Oral Oral  Resp: 18 22 19 16   Height:      Weight:      SpO2: 96% 97% 96% 97%   Weight change:   Intake/Output Summary (Last 24 hours) at 12/01/11 1348 Last data filed at 12/01/11 1159  Gross per 24 hour  Intake    780 ml  Output    550 ml  Net    230 ml    General: Alert, awake, oriented x3, in no acute distress.  HEENT: No bruits, no goiter.  Heart: Regular rate and rhythm, without murmurs, rubs, gallops.  Lungs: Clear to auscultation, bilateral air movement.  Abdomen: Soft, + tenderness with deep palpation over epigastric area, nondistended, positive bowel sounds.  Neuro: Grossly intact, nonfocal.   Lab Results:  [facility] 12/01/11 0925 11/30/11 0340  NA 135 136  K 4.0 4.1  CL 99 102  CO2 26 24  GLUCOSE 187* 142*  BUN 9 11  CREATININE 0.78 0.76  CALCIUM 8.5 8.1*  MG -- --  PHOS -- --    Basename 11/30/11 0340 11/29/11 0004  AST 49* 131*  ALT 83* 145*  ALKPHOS 82 116  BILITOT 0.7 0.5  PROT 6.1 6.8  ALBUMIN 3.2* 3.7    Basename 12/01/11 0925 11/30/11 0340  LIPASE 59 560*  AMYLASE -- --    Jeremy Wu 12/01/11 0925 11/30/11 0340 11/29/11 0004  WBC 15.1* 13.3* --  NEUTROABS -- -- 7.6  HGB 14.0 13.8 --  HCT 41.1 40.5 --  MCV 87.4 88.2 --  PLT 186 171 --   No results found for this basename: CKTOTAL:3,CKMB:3,CKMBINDEX:3,TROPONINI:3 in the last 72 hours No components found with this basename: POCBNP:3 No results found for this  basename: DDIMER:2 in the last 72 hours No results found for this basename: HGBA1C:2 in the last 72 hours  Basename 11/29/11 0520  CHOL 183  HDL 44  LDLCALC 126*  TRIG 67  CHOLHDL 4.2  LDLDIRECT --   No results found for this basename: TSH,T4TOTAL,FREET3,T3FREE,THYROIDAB in the last 72 hours No results found for this basename: VITAMINB12:2,FOLATE:2,FERRITIN:2,TIBC:2,IRON:2,RETICCTPCT:2 in the last 72 hours  Micro Results: No results found for this or any previous visit (from the past 240 hour(s)).  Studies/Results: No results found.  Medications: I have reviewed the patient's current medications.  1) Pancreatitis: At this point suspect patient may have gall stone pancreatitis given the results from his recent abdominal ultrasound. His triglyceride level was 67.  -advance diet as tolerated. Low fat diet today  -Will d/c MIVF -monitor and trend Lipase levels.  Currently within normal limits 59. -first episode reportedly thus will hold off on consulting general surgery. Have discussed with caregiver and have recommended that if patient continues to have pancreatitis patient may consider elective cholecystectomy.  - D/c dilaudid given improvement in abdominal discomfort.  2) GAD: Will continue home regimen at this juncture.      LOS: 3 days   [name] M.D.  [facility] 12/01/2011, 1:48 PM

## 2011-12-01 NOTE — Progress Notes (Signed)
CSW received referral to help with placement options. Pt is from [place]. CSW consulted with [facility]. Pt can D/C at any time when medically ready.

## 2011-12-02 DIAGNOSIS — F411 Generalized anxiety disorder: Secondary | ICD-10-CM

## 2011-12-02 DIAGNOSIS — K859 Acute pancreatitis without necrosis or infection, unspecified: Secondary | ICD-10-CM

## 2011-12-02 LAB — BASIC METABOLIC PANEL
BUN: 11 mg/dL (ref 6–23)
Chloride: 93 mEq/L — ABNORMAL LOW (ref 96–112)
GFR calc Af Amer: >90 mL/min (ref >90)
Potassium: 3.6 mEq/L (ref 3.5–5.1)
Sodium: 131 mEq/L — ABNORMAL LOW (ref 135–145)

## 2011-12-02 LAB — CBC
HCT: 40.7 % (ref 39.0–52.0)
Hemoglobin: 14.2 g/dL (ref 13.0–17.0)
MCHC: 34.9 g/dL (ref 30.0–36.0)
RBC: 4.74 MIL/uL (ref 4.22–5.81)
WBC: 12.4 10*3/uL — ABNORMAL HIGH (ref 4.0–10.5)

## 2011-12-02 MED ORDER — LEVOFLOXACIN 500 MG PO TABS: 500.0000 mg | ORAL_TABLET | Freq: Every day | ORAL | Status: AC

## 2011-12-02 NOTE — Discharge Summary (Signed)
Physician Discharge Summary  Jeremy Wu:[id number] DOB: 1961/04/18 DOA: 11/28/2011  PCP: Provider Not In System  Admit date: 11/28/2011 Discharge date: 12/02/2011  Discharge Diagnoses:  Active Problems:  Acute pancreatitis  Generalized anxiety disorder   Discharge Condition: Stable  Disposition:   History of present illness:  From original HPI: Patient is a 51 y/o CM with PMH of anxiety that presented with Abdominal discomfort to the ED. Pain started two days ago. Was described as an achy pain that did not radiate and was located at the upper portion of his abdomen. Made worse with po intake. Nothing that the patient is aware of made it better while he was home. Denies any fever, chills, emesis, or BRBPR. Brother is care giver and reports that patient resides at an ALF. Reportedly patient developed high anxiety after parents who were initial caregivers were not able to care for him further.  While in the ED patient was given morphine, anti emetic (zofran), and CT of abdomen was obtained after patient was found to have an elevated lipase of 3000. CT of abdomen interpreted as inflammatory infiltration and fluid around pancrease consistent with pancreatitis. No evidence of pancreatic necrosis, abscess, or stenosis.  U/S of abdomen was subsequently ordered and interpreted as fatty infiltration of the liver with gallstones.   Hospital Course:  Patient was placed on IVF and made NPO.  His lipase trended down on this regimen and his abdominal discomfort subsided.  He was able to be transitioned to low fat diet and his IVF was eventually discontinued.  Patient currently denies any abominal discomfort and will be transitioned to his ALF facility today.  1) Pancreatitis: At this point suspect patient may have gall stone pancreatitis given the results from his recent abdominal ultrasound.  -Patient will be transitioned to home with recommended low fat diet.  -first episode reportedly thus  will hold off on consulting general surgery at this juncture.   2) GAD: Will continue home regimen at this juncture  3) Leukocytosis:  Likely secondary to pancreatitis.  Index of suspicion is low for active infection.  But at this juncture will discharge on levaquin for short course of antibiotics 5 days total.   Discharge Exam: Filed Vitals:   12/02/11 0656  BP: 156/90  Pulse: 102  Temp: 98.4 F (36.9 C)  Resp: 20   Filed Vitals:   12/01/11 0541 12/01/11 1428 12/01/11 2134 12/02/11 0656  BP: 154/88 157/85 142/80 156/90  Pulse: 71 90 85 102  Temp: 99.1 F (37.3 C) 99.3 F (37.4 C) 98.8 F (37.1 C) 98.4 F (36.9 C)  TempSrc: Oral Oral Oral Temporal  Resp: 16 20 16 20   Height:      Weight:      SpO2: 97% 96% 98% 97%   General: Alert, awake, oriented x3, in no acute distress. HEENT: No bruits, no goiter. Heart: Regular rate and rhythm, without murmurs, rubs, gallops. Lungs: Clear to auscultation bilaterally. No wheezes Abdomen: Soft, nontender, nondistended, positive bowel sounds. Extremities: No clubbing cyanosis or edema with positive pedal pulses. Neuro: Grossly intact, nonfocal.   Discharge Instructions  Discharge Orders    Future Orders Please Complete By Expires   Diet - low sodium heart healthy      Increase activity slowly      Discharge instructions      Comments:   Please take medication as indicated.  Follow up with your primary care physician in 1-2 weeks or sooner should any new concerns arise.  Call MD for:  temperature >100.4      Call MD for:  redness, tenderness, or signs of infection (pain, swelling, redness, odor or green/yellow discharge around incision site)      Call MD for:  persistant nausea and vomiting        Medication List  As of 12/02/2011 12:21 PM   TAKE these medications         cholecalciferol 1000 UNITS tablet   Commonly known as: VITAMIN D   Take 1,000 Units by mouth daily.      levofloxacin 500 MG tablet   Commonly known as:  LEVAQUIN   Take 1 tablet (500 mg total) by mouth daily.      LORazepam 0.5 MG tablet   Commonly known as: ATIVAN   Take 0.5 mg by mouth daily as needed.      omeprazole 20 MG capsule   Commonly known as: PRILOSEC   Take 20 mg by mouth daily.              The results of significant diagnostics from this hospitalization (including imaging, microbiology, ancillary and laboratory) are listed below for reference.    Significant Diagnostic Studies: US Abdomen Complete  11/29/2011  *RADIOLOGY REPORT*  Clinical Data:  Evaluate for gallstones  ABDOMINAL ULTRASOUND COMPLETE  Comparison:  None.  Findings:  Gallbladder:  Multiple small stones are noted within the lumen of the gallbladder which measure up to 5 mm.  No gallbladder wall thickening identified.  Negative sonographic Murphy's sign.  Common Bile Duct:  Within normal limits in caliber.  Liver: No focal mass lesion identified.  The liver is diffusely echogenic consistent with fatty infiltration.  IVC:  Appears normal.  Pancreas:  No abnormality identified.  Spleen:  Within normal limits in size and echotexture.  Right kidney:  Normal in size and parenchymal echogenicity.  No evidence of mass or hydronephrosis.  Left kidney:  Normal in size and parenchymal echogenicity.  No evidence of mass or hydronephrosis.  Abdominal Aorta:  No aneurysm identified.  IMPRESSION:  1.  Fatty infiltration of the liver. 2.  Gallstones.  Original Report Authenticated By: Rosealee Albee, M.D.   Ct Abdomen Pelvis W Contrast  11/29/2011  *RADIOLOGY REPORT*  Clinical Data: Mid abdominal pain.  Elevated lipase.  White cell count 10.  CT ABDOMEN AND PELVIS WITH CONTRAST  Technique:  Multidetector CT imaging of the abdomen and pelvis was performed following the standard protocol during bolus administration of intravenous contrast.  Contrast: OMNIPAQUE IOHEXOL 300 MG/ML  SOLN  Comparison: None.  Findings: Atelectasis in the lung bases.  Diffuse low attenuation  change throughout the liver consistent with diffuse fatty infiltration.  Spleen size is normal.  Gallbladder and bile ducts are normal.  There is infiltration of the peripancreatic fat with fluid layering in the pericolic gutters bilaterally.  Changes are consistent with pancreatitis.  Normal homogeneous pancreatic parenchymal enhancement.  No evidence of focal pancreatic necrosis.  No peripancreatic abscess.  Adrenal glands and kidneys are unremarkable.  Normal caliber abdominal aorta.  No retroperitoneal lymphadenopathy.  The stomach and small bowel are not abnormally distended.  Stool filled colon without distension or wall thickening.  Prominent visceral adipose tissues.  Pelvis: The appendix is normal.  Prostate gland is not enlarged. Bladder wall is not thickened.  No free or loculated pelvic fluid collections.  Diverticula in the sigmoid colon without diverticulitis.  Normal alignment of the lumbar spine.  Hemangioma in L2.  IMPRESSION: Inflammatory infiltration and  fluid around the pancreas consistent with pancreatitis.  No evidence of pancreatic necrosis, abscess, or stenosis.  Original Report Authenticated By: Marlon Pel, M.D.    Microbiology: No results found for this or any previous visit (from the past 240 hour(s)).   Labs: Basic Metabolic Panel:  Lab 12/02/11 1610 12/01/11 0925 11/30/11 0340 11/29/11 0004  NA 131* 135 136 140  K 3.6 4.0 -- --  CL 93* 99 102 106  CO2 26 26 24 19   GLUCOSE 178* 187* 142* 129*  BUN 11 9 11 16   CREATININE 0.69 0.78 0.76 0.79  CALCIUM 8.8 8.5 8.1* 9.3  MG -- -- -- --  PHOS -- -- -- --   Liver Function Tests:  Lab 11/30/11 0340 11/29/11 0004  AST 49* 131*  ALT 83* 145*  ALKPHOS 82 116  BILITOT 0.7 0.5  PROT 6.1 6.8  ALBUMIN 3.2* 3.7    Lab 12/01/11 0925 11/30/11 0340 11/29/11 0004  LIPASE 59 560* >3000*  AMYLASE -- -- --   No results found for this basename: AMMONIA:5 in the last 168 hours CBC:  Lab 12/02/11 0840 12/01/11 0925  11/30/11 0340 11/29/11 0004  WBC 12.4* 15.1* 13.3* 10.0  NEUTROABS -- -- -- 7.6  HGB 14.2 14.0 13.8 16.3  HCT 40.7 41.1 40.5 49.4  MCV 85.9 87.4 88.2 90.3  PLT 201 186 171 158   Cardiac Enzymes: No results found for this basename: CKTOTAL:5,CKMB:5,CKMBINDEX:5,TROPONINI:5 in the last 168 hours BNP: No components found with this basename: POCBNP:5 CBG: No results found for this basename: GLUCAP:5 in the last 168 hours  Time coordinating discharge: > 35 minutes discussing with patient, family, nursing, placing orders, medical decision making, documenting, examining patient, documenting, billing, updating information services.  Signed:  [name]  [facility] 12/02/2011, 12:21 PM

## 2011-12-02 NOTE — Progress Notes (Signed)
CSW was contacted regarding Jeremy Wu back to ALF at [facility].  CSW spoke with ALF facility to confirm Wu plans.  MD has contacted Jeremy's brother Jeremy Wu) concerning Wu plans and brother will be transporting Jeremy back to facility.   Prescriptions left in the wall chart for Brother.   Nurse will call brother when Jeremy ready.  Leron Croak, LCSWA [employer] [phone]

## 2011-12-02 NOTE — Progress Notes (Signed)
DC to ALF. Transport by brother Ed. DC instructions & Levaquin script reviewed & given to brother. No change from AM assessment.Hartley Barefoot

## 2016-06-09 ENCOUNTER — Emergency Department (HOSPITAL_COMMUNITY)
Admission: EM | Admit: 2016-06-09 | Discharge: 2016-06-10 | Disposition: A | Discharge status: Home / Self Care | Payer: BLUE CROSS/BLUE SHIELD | Attending: Emergency Medicine | Admitting: Emergency Medicine

## 2016-06-09 ENCOUNTER — Emergency Department (HOSPITAL_COMMUNITY): Payer: Self-pay

## 2016-06-09 ENCOUNTER — Encounter (HOSPITAL_COMMUNITY): Payer: Self-pay

## 2016-06-09 DIAGNOSIS — R630 Anorexia: Secondary | ICD-10-CM

## 2016-06-09 DIAGNOSIS — R4182 Altered mental status, unspecified: Secondary | ICD-10-CM | POA: Insufficient documentation

## 2016-06-09 DIAGNOSIS — F332 Major depressive disorder, recurrent severe without psychotic features: Secondary | ICD-10-CM | POA: Diagnosis present

## 2016-06-09 DIAGNOSIS — F419 Anxiety disorder, unspecified: Secondary | ICD-10-CM | POA: Insufficient documentation

## 2016-06-09 DIAGNOSIS — F99 Mental disorder, not otherwise specified: Secondary | ICD-10-CM

## 2016-06-09 DIAGNOSIS — Z79899 Other long term (current) drug therapy: Secondary | ICD-10-CM | POA: Insufficient documentation

## 2016-06-09 LAB — ACETAMINOPHEN LEVEL

## 2016-06-09 LAB — URINALYSIS, ROUTINE W REFLEX MICROSCOPIC
Bilirubin Urine: NEGATIVE
Glucose, UA: NEGATIVE mg/dL
Hgb urine dipstick: NEGATIVE
KETONES UR: 20 mg/dL — AB
LEUKOCYTES UA: NEGATIVE
NITRITE: NEGATIVE
PH: 5 (ref 5.0–8.0)
Protein, ur: NEGATIVE mg/dL
SPECIFIC GRAVITY, URINE: 1.027 (ref 1.005–1.030)

## 2016-06-09 LAB — CBC WITH DIFFERENTIAL/PLATELET
Basophils Absolute: 0 10*3/uL (ref 0.0–0.1)
Basophils Relative: 0 %
EOS ABS: 0.1 10*3/uL (ref 0.0–0.7)
EOS PCT: 1 %
HCT: 38.9 % — ABNORMAL LOW (ref 39.0–52.0)
Hemoglobin: 13.7 g/dL (ref 13.0–17.0)
LYMPHS ABS: 2.6 10*3/uL (ref 0.7–4.0)
Lymphocytes Relative: 40 %
MCH: 30.8 pg (ref 26.0–34.0)
MCHC: 35.2 g/dL (ref 30.0–36.0)
MCV: 87.4 fL (ref 78.0–100.0)
MONO ABS: 0.7 10*3/uL (ref 0.1–1.0)
MONOS PCT: 10 %
Neutro Abs: 3.2 10*3/uL (ref 1.7–7.7)
Neutrophils Relative %: 49 %
PLATELETS: 220 10*3/uL (ref 150–400)
RBC: 4.45 MIL/uL (ref 4.22–5.81)
RDW: 13.7 % (ref 11.5–15.5)
WBC: 6.5 10*3/uL (ref 4.0–10.5)

## 2016-06-09 LAB — ETHANOL: Alcohol, Ethyl (B): <5 mg/dL (ref <5)

## 2016-06-09 LAB — COMPREHENSIVE METABOLIC PANEL
ALT: 24 U/L (ref 17–63)
ANION GAP: 10 (ref 5–15)
AST: 33 U/L (ref 15–41)
Albumin: 4.3 g/dL (ref 3.5–5.0)
Alkaline Phosphatase: 60 U/L (ref 38–126)
BUN: 28 mg/dL — ABNORMAL HIGH (ref 6–20)
CHLORIDE: 101 mmol/L (ref 101–111)
CO2: 27 mmol/L (ref 22–32)
Calcium: 9.7 mg/dL (ref 8.9–10.3)
Creatinine, Ser: 1.18 mg/dL (ref 0.61–1.24)
Glucose, Bld: 105 mg/dL — ABNORMAL HIGH (ref 65–99)
Potassium: 4 mmol/L (ref 3.5–5.1)
SODIUM: 138 mmol/L (ref 135–145)
Total Bilirubin: 1 mg/dL (ref 0.3–1.2)
Total Protein: 7 g/dL (ref 6.5–8.1)

## 2016-06-09 LAB — RAPID URINE DRUG SCREEN, HOSP PERFORMED
Amphetamines: NOT DETECTED
BARBITURATES: NOT DETECTED
Benzodiazepines: NOT DETECTED
COCAINE: NOT DETECTED
OPIATES: NOT DETECTED
TETRAHYDROCANNABINOL: NOT DETECTED

## 2016-06-09 LAB — SALICYLATE LEVEL

## 2016-06-09 LAB — TSH: TSH: 1.486 u[IU]/mL (ref 0.350–4.500)

## 2016-06-09 MED ORDER — SODIUM CHLORIDE 0.9 % IV BOLUS (SEPSIS)
1000.0000 mL | Freq: Once | INTRAVENOUS | Status: AC
  Administered 2016-06-09: 1000 mL via INTRAVENOUS

## 2016-06-09 NOTE — ED Triage Notes (Signed)
Pt. Lives in [place] and Okey DupreRose is his caregiver and his brother is also present which is POA and they report that pt. Is not eating or walking.  He is unable to take care of himself.  They would like a psychiatric evaluation completed.   Pt. Denies any pain or discomfort.    Skin is warm and dry and pink.    Pt. Is alert and oriented X4.

## 2016-06-09 NOTE — BH Assessment (Signed)
Tele Assessment Note   Jeremy Wu is an 55 y.o. male who presents to the ED with his brother, Burnard Leighd Mccluney. Pt was unresponsive during the assessment and when asked why he was in the hospital pt began to speak, then suddenly stopped and looked into the distance. Pt's brother then began to explain the presenting problem. Pt's brother reports that the pt has recently regressed, began wearing diapers about 1 year ago and at times is unresponsive to others. Pt's brother reports the pt has been displaying paranoid delusions and "thinking people are after him or the police are coming to get him." Pt's brother reports the pt began to detoriate after his parents health got worse and began to decline. Pt's brother reports the pt lived with their parents all of his life and when his mother died 10 years ago, the pt "had a mental breakdown."   Pt's brother reports the pt sometimes says he cannot walk, refuses to eat, and will stare mindlessly. Pt's brother reports yesterday the pt was at [employer] with his caregivers eating and "everything was fine." Pt's brother reports the pt goes through phases where he is "fine" and other times when the pt is completely catatonic. Pt's brother reports the pt is taking medication but feels it is not working. Pt's brother reports the pt sometimes "rubs and scratches his arms" in a nervous or anxious manner and "shakes at night as if he is having a seizure."   Per Nira ConnJason Berry, FNP pt will need an AM psych eval. Shanda BumpsJessica, RN has been notified of the recommendation.   Diagnosis: Deferred  Past Medical History:  Past Medical History:  Diagnosis Date   Anxiety    GERD (gastroesophageal reflux disease)     History reviewed. No pertinent surgical history.  Family History: No family history on file.  Social History:  reports that he has never smoked. He has never used smokeless tobacco. He reports that he does not drink alcohol or use drugs.  Additional Social History:   Alcohol / Drug Use Pain Medications: Pt denies abuse  Prescriptions: Pt denies abuse  Over the Counter: Pt denies abuse  History of alcohol / drug use?: No history of alcohol / drug abuse  CIWA: CIWA-Ar BP: 109/58 Pulse Rate: 73 COWS:    PATIENT STRENGTHS: (choose at least two) Physical Health Supportive family/friends  Allergies:  Allergies  Allergen Reactions   Sulfa Antibiotics Nausea And Vomiting    Home Medications:  (Not in a hospital admission)  OB/GYN Status:  No LMP for male patient.  General Assessment Data [facility] Assessment: In system Is this a Tele or Face-to-Face Assessment?: Tele Assessment Is this an Initial Assessment or a Re-assessment for this encounter?: Initial Assessment Marital status: Single Is patient pregnant?: No Pregnancy Status: No Living Arrangements: Group Home ([facility]) Can pt return to current living arrangement?: Yes Admission Status: Voluntary Is patient capable of signing voluntary admission?: Yes Referral Source: Self/Family/Friend Insurance type: none     Crisis Care Plan Living Arrangements: Group Home ([facility]) Name of Psychiatrist: none current Name of Therapist: none current  Education Status Is patient currently in school?: No Highest grade of school patient has completed: some college  Risk to self with the past 6 months Suicidal Ideation: No Has patient been a risk to self within the past 6 months prior to admission? : No Suicidal Intent: No Has patient had any suicidal intent within the past 6 months  prior to admission? : No Is patient at risk for suicide?: No Suicidal Plan?: No Has patient had any suicidal plan within the past 6 months prior to admission? : No Access to Means: No What has been your use of drugs/alcohol within the last 12 months?: denies Previous Attempts/Gestures: No Intentional Self Injurious Behavior: None Family Suicide  History: No Recent stressful life event(s): Other (Comment) (none reported) Persecutory voices/beliefs?: No Depression: No Substance abuse history and/or treatment for substance abuse?: No Suicide prevention information given to non-admitted patients: Not applicable  Risk to Others within the past 6 months Homicidal Ideation: No Does patient have any lifetime risk of violence toward others beyond the six months prior to admission? : No Thoughts of Harm to Others: No Current Homicidal Intent: No Current Homicidal Plan: No Access to Homicidal Means: No History of harm to others?: No Assessment of Violence: None Noted Does patient have access to weapons?: No Criminal Charges Pending?: No Does patient have a court date: No Is patient on probation?: No  Psychosis Hallucinations: None noted Delusions: Persecutory  Mental Status Report Appearance/Hygiene: In scrubs Eye Contact: Fair Motor Activity: Unable to assess Speech: Unable to assess Level of Consciousness: Quiet/awake, Unresponsive (To pain or command) (unresponsive at times during the assessment ) Mood: Other (Comment) (pt presented to be partially catatonic) Affect: Constricted Anxiety Level: None Thought Processes: Thought Blocking Judgement: Partial Orientation: Not oriented Obsessive Compulsive Thoughts/Behaviors: None  Cognitive Functioning Concentration: Fair Memory: Recent Intact, Remote Intact IQ: Average Insight: Fair Impulse Control: Fair Appetite: Poor Sleep: Decreased Total Hours of Sleep: 5 Vegetative Symptoms: None  ADLScreening [facility] Assessment Services) Patient's cognitive ability adequate to safely complete daily activities?: No Patient able to express need for assistance with ADLs?: No Independently performs ADLs?: No (pt reports recent changes in his ability to handle daily tasks)  Prior Inpatient Therapy Prior Inpatient Therapy: Yes Prior Therapy Dates: 2007 Prior Therapy  Facilty/Provider(s): [place], [place] Reason for Treatment: Pt's brother reports the pt "had a mental breakdown when his mother died"  Prior Outpatient Therapy Prior Outpatient Therapy: Yes Prior Therapy Dates: unknown Prior Therapy Facilty/Provider(s): [facility] Reason for Treatment: med management Does patient have an ACCT team?: No Does patient have Intensive In-House Services?  : No Does patient have [facility] services? : No Does patient have P4CC services?: No  ADL Screening (condition at time of admission) Patient's cognitive ability adequate to safely complete daily activities?: No Is the patient deaf or have difficulty hearing?: No Does the patient have difficulty seeing, even when wearing glasses/contacts?: No Does the patient have difficulty concentrating, remembering, or making decisions?: Yes Patient able to express need for assistance with ADLs?: No Does the patient have difficulty dressing or bathing?: Yes Independently performs ADLs?: No (pt reports recent changes in his ability to handle daily tasks) Does the patient have difficulty walking or climbing stairs?: Yes Weakness of Legs: Both (pt's brother reports the pt has recently regressed and sometimes acts as if he cannot walk) Weakness of Arms/Hands: None  Home Assistive Devices/Equipment Home Assistive Devices/Equipment: None    Abuse/Neglect Assessment (Assessment to be complete while patient is alone) Physical Abuse: Denies Verbal Abuse: Denies Sexual Abuse: Denies Exploitation of patient/patient's resources: Denies Self-Neglect: Denies     Merchant navy officerAdvance Directives (For Healthcare) Does Patient Have a Medical Advance Directive?: Yes Type of Advance Directive: Healthcare Power of Attorney Copy of Healthcare Power of Attorney in Chart?: No - copy requested Would patient like information on creating a medical  advance directive?: No - Patient declined    Additional Information 1:1 In  Past 12 Months?: No CIRT Risk: No Elopement Risk: No Does patient have medical clearance?: Yes     Disposition:  Disposition Initial Assessment Completed for this Encounter: Yes Disposition of Patient: Other dispositions Other disposition(s): Other (Comment) (AM psych eval per Nira Conn, FNP )  Karolee Ohs 06/09/2016 10:23 PM

## 2016-06-09 NOTE — ED Provider Notes (Signed)
[facility]-EMERGENCY DEPT Provider Note   CSN: [id number] Arrival date & time: 06/09/16  1349     History   Chief Complaint Chief Complaint  Patient presents with  . Medical Clearance    HPI Jeremy Wu is a 55 y.o. male.  HPI 55 year old male who presents for medical clearance. He has a history of cognitive impairment, lives in [place] and has a caregiver. His brother is his POA, and I knows him very well. States that over the past week, he has had decreased appetite not wanting to eat or drink. Family has noted that he has become extremely anxious at times, and often behaving inappropriately where he seems like he almost forgets how to do daily activities such as walking or eating. He has had periods of time where he seems unresponsive to people talking to him. Patient brother states that this is similar to several years ago when he had a mental breakdown requiring inpatient hospitalization. He is on multiple psychiatric medications, I managed by the PCP, and his brother is concerned that he is unable to manage his psychiatric condition. He has not had fever, cough, difficulty breathing, nausea or vomiting, diarrhea, abdominal pain or chest pain. Past Medical History:  Diagnosis Date  . Anxiety   . GERD (gastroesophageal reflux disease)     Patient Active Problem List   Diagnosis Date Noted  . Acute pancreatitis 11/29/2011  . Generalized anxiety disorder 11/29/2011    History reviewed. No pertinent surgical history.     Home Medications    Prior to Admission medications   Medication Sig Start Date End Date Taking? Authorizing Provider  cholecalciferol (VITAMIN D) 1000 UNITS tablet Take 1,000 Units by mouth daily.   Yes Historical Provider, MD  divalproex (DEPAKOTE) 125 MG DR tablet Take 125 mg by mouth 2 (two) times daily. 04/17/16  Yes Historical Provider, MD  lisinopril (PRINIVIL,ZESTRIL) 10 MG tablet Take 10 mg by mouth daily. 05/16/16  Yes Historical Provider,  MD  LORazepam (ATIVAN) 0.5 MG tablet Take 0.5 mg by mouth at bedtime.    Yes Historical Provider, MD  metFORMIN (GLUCOPHAGE) 500 MG tablet Take 500 mg by mouth daily. 05/16/16  Yes Historical Provider, MD  OLANZapine (ZYPREXA) 5 MG tablet Take 5 mg by mouth at bedtime. 05/16/16  Yes Historical Provider, MD  oxybutynin (DITROPAN-XL) 10 MG 24 hr tablet Take 10 mg by mouth daily. 05/16/16  Yes Historical Provider, MD  PARoxetine (PAXIL) 20 MG tablet Take 20 mg by mouth at bedtime. 05/16/16  Yes Historical Provider, MD  QUEtiapine (SEROQUEL) 50 MG tablet Take 50 mg by mouth at bedtime. 05/16/16  Yes Historical Provider, MD  triamterene-hydrochlorothiazide (MAXZIDE-25) 37.5-25 MG tablet Take 0.5 tablets by mouth 3 (three) times daily. 05/16/16  Yes Historical Provider, MD  valproic acid (DEPAKENE) 250 MG capsule Take 250 mg by mouth 2 (two) times daily. 06/09/16  Yes Historical Provider, MD  omeprazole (PRILOSEC) 20 MG capsule Take 20 mg by mouth daily.    Historical Provider, MD    Family History No family history on file.  Social History Social History  Substance Use Topics  . Smoking status: Never Smoker  . Smokeless tobacco: Never Used  . Alcohol use No     Allergies   Sulfa antibiotics   Review of Systems Review of Systems 10/14 systems reviewed and are negative other than those stated in the HPI   Physical Exam Updated Vital Signs BP 119/66 (BP Location: Right Arm)   Pulse 86  Temp 97.8 F (36.6 C) (Oral)   Resp 14   Ht 6' (1.829 m)   Wt 189 lb 2 oz (85.8 kg)   SpO2 97%   BMI 25.65 kg/m   Physical Exam Physical Exam  Nursing note and vitals reviewed. Constitutional: Well developed, well nourished, non-toxic, and in no acute distress Head: Normocephalic and atraumatic.  Mouth/Throat: Oropharynx is clear and dry mucous membranes.  Neck: Normal range of motion. Neck supple.  Cardiovascular: Normal rate and regular rhythm.   Pulmonary/Chest: Effort normal and breath  sounds normal.  Abdominal: Soft. There is no tenderness. There is no rebound and no guarding.  Musculoskeletal: Normal range of motion.  Neurological: Alert, no facial droop, fluent speech, moves all extremities symmetrically Skin: Skin is warm and dry.  Psychiatric: Cooperative   ED Treatments / Results  Labs (all labs ordered are listed, but only abnormal results are displayed) Labs Reviewed  CBC WITH DIFFERENTIAL/PLATELET - Abnormal; Notable for the following:       Result Value   HCT 38.9 (*)    All other components within normal limits  COMPREHENSIVE METABOLIC PANEL - Abnormal; Notable for the following:    Glucose, Bld 105 (*)    BUN 28 (*)    All other components within normal limits  URINALYSIS, ROUTINE W REFLEX MICROSCOPIC - Abnormal; Notable for the following:    Ketones, ur 20 (*)    All other components within normal limits  ACETAMINOPHEN LEVEL - Abnormal; Notable for the following:    Acetaminophen (Tylenol), Serum <10 (*)    All other components within normal limits  TSH  SALICYLATE LEVEL  ETHANOL  RAPID URINE DRUG SCREEN, HOSP PERFORMED    EKG  EKG Interpretation None       Radiology Dg Chest 2 View  Result Date: 06/09/2016 CLINICAL DATA:  Altered mental status. EXAM: CHEST  2 VIEW COMPARISON:  None. FINDINGS: The heart size and mediastinal contours are within normal limits. Both lungs are clear. The visualized skeletal structures are unremarkable. IMPRESSION: Normal chest. Electronically Signed   By: Francene Boyers M.D.   On: 06/09/2016 17:23   Ct Head Wo Contrast  Result Date: 06/09/2016 CLINICAL DATA:  Cognitive impairment. Decreasing appetite. Extremely anxious. Inappropriate behavior. EXAM: CT HEAD WITHOUT CONTRAST TECHNIQUE: Contiguous axial images were obtained from the base of the skull through the vertex without intravenous contrast. COMPARISON:  None. FINDINGS: Brain: No subdural, epidural, or subarachnoid hemorrhage. Ventricles and sulci are  normal for age. Cerebellum, brainstem, and basal cisterns are normal. No mass, mass effect, or midline shift. No cortical ischemia or infarct identified. Vascular: No hyperdense vessel or unexpected calcification. Skull: Normal. Negative for fracture or focal lesion. Sinuses/Orbits: Significant opacification in the right maxillary sinus with no bony sclerosis or expansion of the sinus. High attenuation within the opacified material suggest inspissated mucus. Paranasal sinuses, mastoid air cells, and middle ears are otherwise normal. Other: None. IMPRESSION: 1. No acute intracranial process. 2. Right maxillary sinus opacification as above. Electronically Signed   By: Gerome Sam III M.D   On: 06/09/2016 18:06    Procedures Procedures (including critical care time)  Medications Ordered in ED Medications  acetaminophen (TYLENOL) tablet 650 mg (not administered)  ondansetron (ZOFRAN) tablet 4 mg (not administered)  cholecalciferol (VITAMIN D) tablet 1,000 Units (not administered)  divalproex (DEPAKOTE) DR tablet 125 mg (not administered)  lisinopril (PRINIVIL,ZESTRIL) tablet 10 mg (not administered)  LORazepam (ATIVAN) tablet 0.5 mg (not administered)  metFORMIN (GLUCOPHAGE) tablet 500 mg (  not administered)  OLANZapine (ZYPREXA) tablet 5 mg (not administered)  oxybutynin (DITROPAN-XL) 24 hr tablet 10 mg (not administered)  PARoxetine (PAXIL) tablet 20 mg (not administered)  QUEtiapine (SEROQUEL) tablet 50 mg (not administered)  triamterene-hydrochlorothiazide (MAXZIDE-25) 37.5-25 MG per tablet 0.5 tablet (not administered)  valproic acid (DEPAKENE) 250 MG capsule 250 mg (not administered)  sodium chloride 0.9 % bolus 1,000 mL (0 mLs Intravenous Stopped 06/09/16 2159)     Initial Impression / Assessment and Plan / ED Course  I have reviewed the triage vital signs and the nursing notes.  Pertinent labs & imaging results that were available during my care of the patient were reviewed by me  and considered in my medical decision making (see chart for details).  Clinical Course     55 year old male with history of cognitive impairment who is brought in by family for psychiatric evaluation. He is nontoxic in no acute distress with normal vital signs and no complaints. Exam overall nonfocal aside from appearing mildly dry and dehydrated. Basic blood work shows no Restaurant manager, fast foodmajor ultralight or metabolic derangements. Given IV fluids. UA without infection, chest x-ray visualized shows no acute cardiopulmonary processes. CT head visualized and shows no acute intracranial processes. No clear medical process causing his symptoms this time. I will obtain TTS consult. Recommending a.m. reevaluation.   Final Clinical Impressions(s) / ED Diagnoses   Final diagnoses:  Decreased appetite  Psychiatric disturbance    New Prescriptions New Prescriptions   No medications on file     Lavera Guiseana Duo Marcellas Marchant, MD 06/10/16 [phone]

## 2016-06-10 DIAGNOSIS — F332 Major depressive disorder, recurrent severe without psychotic features: Secondary | ICD-10-CM | POA: Diagnosis not present

## 2016-06-10 DIAGNOSIS — Z882 Allergy status to sulfonamides status: Secondary | ICD-10-CM

## 2016-06-10 DIAGNOSIS — F411 Generalized anxiety disorder: Secondary | ICD-10-CM | POA: Diagnosis not present

## 2016-06-10 DIAGNOSIS — Z79899 Other long term (current) drug therapy: Secondary | ICD-10-CM | POA: Diagnosis not present

## 2016-06-10 MED ORDER — ACETAMINOPHEN 325 MG PO TABS: 650.0000 mg | ORAL_TABLET | ORAL | Status: DC | PRN

## 2016-06-10 MED ORDER — DIVALPROEX SODIUM 125 MG PO DR TAB
125.0000 mg | DELAYED_RELEASE_TABLET | Freq: Two times a day (BID) | ORAL | Status: DC
  Administered 2016-06-10 (×2): 125 mg via ORAL
  Filled 2016-06-10 (×2): qty 1

## 2016-06-10 MED ORDER — OLANZAPINE 5 MG PO TABS
5.0000 mg | ORAL_TABLET | Freq: Every day | ORAL | Status: DC
  Administered 2016-06-10: 5 mg via ORAL
  Filled 2016-06-10: qty 1

## 2016-06-10 MED ORDER — LORAZEPAM 0.5 MG PO TABS
0.5000 mg | ORAL_TABLET | Freq: Every day | ORAL | Status: DC
  Administered 2016-06-10: 0.5 mg via ORAL
  Filled 2016-06-10: qty 1

## 2016-06-10 MED ORDER — QUETIAPINE FUMARATE 25 MG PO TABS
50.0000 mg | ORAL_TABLET | Freq: Every day | ORAL | Status: DC
  Administered 2016-06-10: 50 mg via ORAL
  Filled 2016-06-10: qty 2

## 2016-06-10 MED ORDER — TRIAMTERENE-HCTZ 37.5-25 MG PO TABS
0.5000 | ORAL_TABLET | Freq: Three times a day (TID) | ORAL | Status: DC
  Administered 2016-06-10: 0.5 via ORAL
  Filled 2016-06-10: qty 1

## 2016-06-10 MED ORDER — METFORMIN HCL 500 MG PO TABS
500.0000 mg | ORAL_TABLET | Freq: Every day | ORAL | Status: DC
  Administered 2016-06-10: 500 mg via ORAL
  Filled 2016-06-10: qty 1

## 2016-06-10 MED ORDER — LORAZEPAM 1 MG PO TABS: 1.0000 mg | ORAL_TABLET | Freq: Three times a day (TID) | ORAL | Status: DC | PRN

## 2016-06-10 MED ORDER — LISINOPRIL 10 MG PO TABS
10.0000 mg | ORAL_TABLET | Freq: Every day | ORAL | Status: DC
  Administered 2016-06-10: 10 mg via ORAL
  Filled 2016-06-10: qty 1

## 2016-06-10 MED ORDER — VITAMIN D 1000 UNITS PO TABS
1000.0000 [IU] | ORAL_TABLET | Freq: Every day | ORAL | Status: DC
  Administered 2016-06-10: 1000 [IU] via ORAL
  Filled 2016-06-10: qty 1

## 2016-06-10 MED ORDER — ONDANSETRON HCL 4 MG PO TABS: 4.0000 mg | ORAL_TABLET | Freq: Three times a day (TID) | ORAL | Status: DC | PRN

## 2016-06-10 MED ORDER — OXYBUTYNIN CHLORIDE ER 10 MG PO TB24
10.0000 mg | ORAL_TABLET | Freq: Every day | ORAL | Status: DC
  Administered 2016-06-10: 10 mg via ORAL
  Filled 2016-06-10: qty 1

## 2016-06-10 MED ORDER — PAROXETINE HCL 20 MG PO TABS
20.0000 mg | ORAL_TABLET | Freq: Every day | ORAL | Status: DC
  Administered 2016-06-10: 20 mg via ORAL
  Filled 2016-06-10: qty 1

## 2016-06-10 MED ORDER — VALPROIC ACID 250 MG PO CAPS
250.0000 mg | ORAL_CAPSULE | Freq: Two times a day (BID) | ORAL | Status: DC
  Administered 2016-06-10 (×2): 250 mg via ORAL
  Filled 2016-06-10 (×2): qty 1

## 2016-06-10 NOTE — Discharge Instructions (Signed)
Dementia Dementia is the loss of two or more brain functions, such as:  Memory.  Decision making.  Behavior.  Speaking.  Thinking.  Problem solving.  There are many types of dementia. The most common type is called progressive dementia. Progressive dementia gets worse with time and it is irreversible. An example of this type of dementia is Alzheimer disease. What are the causes? This condition may be caused by:  Nerve cell damage in the brain.  Genetic mutations.  Certain medicines.  Multiple small strokes.  An infection, such as chronic meningitis.  A metabolic problem, such as vitamin B12 deficiency or thyroid disease.  Pressure on the brain, such as from a tumor or blood clot.  What are the signs or symptoms? Symptoms of this condition include:  Sudden changes in mood.  Depression.  Problems with balance.  Changes in personality.  Poor short-term memory.  Agitation.  Delusions.  Hallucinations.  Having a hard time: ? Speaking thoughts. ? Finding words. ? Solving problems. ? Doing familiar tasks. ? Understanding familiar ideas.  How is this diagnosed? This condition is diagnosed with an assessment by your health care provider. During this assessment, your health care provider will talk with you and your family, friends, or caregivers about your symptoms. A thorough medical history will be taken, and you will have a physical exam and tests. Tests may include:  Lab tests, such as blood or urine tests.  Imaging tests, such as a CT scan, PET scan, or MRI.  A lumbar puncture. This test involves removing and testing a small amount of the fluid that surrounds the brain and spinal cord.  An electroencephalogram (EEG). In this test, small metal discs are used to measure electrical activity in the brain.  Memory tests, cognitive tests, and neuropsychological tests. These tests evaluate brain function.  How is this treated? Treatment depends on the  cause of the dementia. It may involve taking medicines that may help:  To control the dementia.  To slow down the disease.  To manage symptoms.  In some cases, treating the cause of the dementia can improve symptoms, reverse symptoms, or slow down how quickly the dementia gets worse. Your health care provider can help direct you to support groups, organizations, and other health care providers who can help with decisions about your care. Follow these instructions at home: Medicine  Take over-the-counter and prescription medicines only as told by your health care provider.  Avoid taking medicines that can affect thinking, such as pain or sleeping medicines. Lifestyle   Make healthy lifestyle choices: ? Be physically active as told by your health care provider. ? Do not use any tobacco products, such as cigarettes, chewing tobacco, and e-cigarettes. If you need help quitting, ask your health care provider. ? Eat a healthy diet. ? Practice stress-management techniques when you get stressed. ? Stay social.  Drink enough fluid to keep your urine clear or pale yellow.  Make sure to get quality sleep. These tips can help you to get a good night's rest: ? Avoid napping during the day. ? Keep your sleeping area dark and cool. ? Avoid exercising during the few hours before you go to bed. ? Avoid caffeine products in the evening. General instructions  Work with your health care provider to determine what you need help with and what your safety needs are.  If you were given a bracelet that tracks your location, make sure to wear it.  Keep all follow-up visits as told by your   health care provider. This is important. Contact a health care provider if:  You have any new symptoms.  You have problems with choking or swallowing.  You have any symptoms of a different illness. Get help right away if:  You develop a fever.  You have new or worsening confusion.  You have new or  worsening sleepiness.  You have a hard time staying awake.  You or your family members become concerned for your safety. This information is not intended to replace advice given to you by your health care provider. Make sure you discuss any questions you have with your health care provider. Document Released: 12/13/2000 Document Revised: 10/28/2015 Document Reviewed: 03/17/2015 Elsevier Interactive Patient Education  2017 [employer]  

## 2016-06-10 NOTE — ED Provider Notes (Signed)
Patient was assessed by TTS team and deemed appropriate for discharge. Pt ambulating at baseline, no acute distress on exam, and otherwise medically clear. Plan to follow up as needed and return precautions discussed for worsening or new concerning symptoms.     Lyndal Pulleyaniel Mamta Rimmer, MD 06/10/16 [phone]

## 2016-06-10 NOTE — ED Notes (Addendum)
Inadvertently dropped one 25mg  tablet of seroquel onto the floor. Wasted tablet in sharps container, recorded waste in Pyxis and pulled a replacement tablet.

## 2016-06-10 NOTE — ED Notes (Signed)
This RN called to speak with Jacki ConesLaurie[facility] and left message for her to call me back regarding plan of care for patient.

## 2016-06-10 NOTE — ED Notes (Signed)
Ordered regular diet breakfast tray for pt.

## 2016-06-10 NOTE — ED Notes (Signed)
Tele-psych reassessment in process. Pts brother in room as well.
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
## 2016-06-10 NOTE — ED Notes (Signed)
Pt's lunch ordered 
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
## 2016-08-09 ENCOUNTER — Ambulatory Visit (HOSPITAL_COMMUNITY): Payer: Self-pay | Admitting: Psychiatry

## 2019-06-09 ENCOUNTER — Encounter (HOSPITAL_COMMUNITY): Payer: Self-pay | Admitting: Emergency Medicine

## 2019-06-09 ENCOUNTER — Emergency Department (HOSPITAL_COMMUNITY)
Admission: EM | Admit: 2019-06-09 | Discharge: 2019-06-09 | Disposition: A | Discharge status: Home / Self Care | Payer: BC Managed Care – PPO | Attending: Emergency Medicine | Admitting: Emergency Medicine

## 2019-06-09 ENCOUNTER — Emergency Department (HOSPITAL_COMMUNITY): Payer: BC Managed Care – PPO

## 2019-06-09 DIAGNOSIS — W01198A Fall on same level from slipping, tripping and stumbling with subsequent striking against other object, initial encounter: Secondary | ICD-10-CM | POA: Diagnosis not present

## 2019-06-09 DIAGNOSIS — Y9210 Unspecified residential institution as the place of occurrence of the external cause: Secondary | ICD-10-CM | POA: Insufficient documentation

## 2019-06-09 DIAGNOSIS — S0990XA Unspecified injury of head, initial encounter: Secondary | ICD-10-CM | POA: Insufficient documentation

## 2019-06-09 DIAGNOSIS — Z79899 Other long term (current) drug therapy: Secondary | ICD-10-CM | POA: Diagnosis not present

## 2019-06-09 DIAGNOSIS — Y9389 Activity, other specified: Secondary | ICD-10-CM | POA: Insufficient documentation

## 2019-06-09 DIAGNOSIS — Y999 Unspecified external cause status: Secondary | ICD-10-CM | POA: Insufficient documentation

## 2019-06-09 NOTE — ED Notes (Signed)
Report called back to [facility]

## 2019-06-09 NOTE — ED Notes (Signed)
Contacted [facility] reports that patient had fall this morning and couldn't "feel legs". Patient denies reports head pain after hitting head on chair.

## 2019-06-09 NOTE — ED Notes (Signed)
Pt was ambulated, pt states he feels weak, dizzy, and light-headed... Also pt is very confused !

## 2019-06-09 NOTE — ED Triage Notes (Signed)
Patient here from [facility] with complaints of fall today from chair. Reports hitting back of head on chair. No LOC.

## 2019-06-09 NOTE — ED Provider Notes (Signed)
[facility]-EMERGENCY DEPT Provider Note   CSN: [id number] Arrival date & time: 06/09/19  3976     History   Chief Complaint Chief Complaint  Patient presents with  . Fall  . Head Injury    HPI Jeremy Wu is a 58 y.o. male.     58 year old male who presents from facility after having a fall where he struck his head.  No loss of consciousness.  Patient states he lost his balance.  Does complain of some head pain at this time.  No neck discomfort.  No chest or abdominal comfort.  No recent illnesses.  No treatment use prior to arrival.     Past Medical History:  Diagnosis Date  . Anxiety   . GERD (gastroesophageal reflux disease)     Patient Active Problem List   Diagnosis Date Noted  . 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 Acute pancreatitis 11/29/2011  . Generalized anxiety disorder 11/29/2011    History reviewed. No pertinent surgical history.      Home Medications    Prior to Admission medications   Medication Sig Start Date End Date Taking? Authorizing Provider  cholecalciferol (VITAMIN D) 1000 UNITS tablet Take 1,000 Units by mouth daily.    [provider]  divalproex (DEPAKOTE) 125 MG DR tablet Take 125 mg by mouth 2 (two) times daily. 04/17/16   [provider]  lisinopril (PRINIVIL,ZESTRIL) 10 MG tablet Take 10 mg by mouth daily. 05/16/16   [provider]  LORazepam (ATIVAN) 0.5 MG tablet Take 0.5 mg by mouth at bedtime.     [provider]  metFORMIN (GLUCOPHAGE) 500 MG tablet Take 500 mg by mouth daily. 05/16/16   [provider]  OLANZapine (ZYPREXA) 5 MG tablet Take 5 mg by mouth at bedtime. 05/16/16   [provider]  omeprazole (PRILOSEC) 20 MG capsule Take 20 mg by mouth daily.    [provider]  oxybutynin (DITROPAN-XL) 10 MG 24 hr tablet Take 10 mg by mouth daily. 05/16/16   [provider]   PARoxetine (PAXIL) 20 MG tablet Take 20 mg by mouth at bedtime. 05/16/16   [provider]  QUEtiapine (SEROQUEL) 50 MG tablet Take 50 mg by mouth at bedtime. 05/16/16   [provider]  triamterene-hydrochlorothiazide (MAXZIDE-25) 37.5-25 MG tablet Take 0.5 tablets by mouth 3 (three) times daily. 05/16/16   [provider]  valproic acid (DEPAKENE) 250 MG capsule Take 250 mg by mouth 2 (two) times daily. 06/09/16   [provider]    Family History No family history on file.  Social History Social History   Tobacco Use  . Smoking status: Never Smoker  . Smokeless tobacco: Never Used  Substance Use Topics  . Alcohol use: No  . Drug use: No     Allergies   Sulfa antibiotics   Review of Systems Review of Systems  All other systems reviewed and are negative.    Physical Exam Updated Vital Signs BP (!) 105/56 (BP Location: Right Arm)   Pulse 89   Temp 98 F (36.7 C) (Oral)   Resp 19   SpO2 100%   Physical Exam Vitals signs and nursing note reviewed.  Constitutional:      General: He is not in acute distress.    Appearance: Normal appearance. He is well-developed. He is not toxic-appearing.  HENT:     Head: Normocephalic and atraumatic.  Eyes:     General: Lids  are normal.     Conjunctiva/sclera: Conjunctivae normal.     Pupils: Pupils are equal, round, and reactive to light.  Neck:     Musculoskeletal: Normal range of motion and neck supple.     Thyroid: No thyroid mass.     Trachea: No tracheal deviation.  Cardiovascular:     Rate and Rhythm: Normal rate and regular rhythm.     Heart sounds: Normal heart sounds. No murmur. No gallop.   Pulmonary:     Effort: Pulmonary effort is normal. No respiratory distress.     Breath sounds: Normal breath sounds. No stridor. No decreased breath sounds, wheezing, rhonchi or rales.  Abdominal:     General: Bowel sounds are normal. There is no distension.     Palpations: Abdomen is soft.      Tenderness: There is no abdominal tenderness. There is no rebound.  Musculoskeletal: Normal range of motion.        General: No tenderness.  Skin:    General: Skin is warm and dry.     Findings: No abrasion or rash.  Neurological:     Mental Status: He is alert and oriented to person, place, and time.     GCS: GCS eye subscore is 4. GCS verbal subscore is 5. GCS motor subscore is 6.     Cranial Nerves: No cranial nerve deficit.     Sensory: No sensory deficit.  Psychiatric:        Attention and Perception: Attention normal.        Mood and Affect: Affect is blunt.        Speech: Speech normal.        Behavior: Behavior is withdrawn.      ED Treatments / Results  Labs (all labs ordered are listed, but only abnormal results are displayed) Labs Reviewed - No data to display  EKG None  Radiology No results found.  Procedures Procedures (including critical care time)  Medications Ordered in ED Medications - No data to display   Initial Impression / Assessment and Plan / ED Course  I have reviewed the triage vital signs and the nursing notes.  Pertinent labs & imaging results that were available during my care of the patient were reviewed by me and considered in my medical decision making (see chart for details).        Head CT without findings.  Will discharge home  Final Clinical Impressions(s) / ED Diagnoses   Final diagnoses:  None    ED Discharge Orders    None       Lacretia Leigh, MD 06/09/19 1111

## 2019-06-09 NOTE — ED Notes (Signed)
PTAR called  

## 2019-07-07 ENCOUNTER — Emergency Department (HOSPITAL_COMMUNITY): Payer: BC Managed Care – PPO

## 2019-07-07 ENCOUNTER — Encounter (HOSPITAL_COMMUNITY): Payer: Self-pay | Admitting: Emergency Medicine

## 2019-07-07 ENCOUNTER — Other Ambulatory Visit: Payer: Self-pay

## 2019-07-07 ENCOUNTER — Emergency Department (HOSPITAL_COMMUNITY)
Admission: EM | Admit: 2019-07-07 | Discharge: 2019-07-08 | Disposition: A | Discharge status: Home / Self Care | Payer: BC Managed Care – PPO | Attending: Emergency Medicine | Admitting: Emergency Medicine

## 2019-07-07 DIAGNOSIS — Z79899 Other long term (current) drug therapy: Secondary | ICD-10-CM | POA: Diagnosis not present

## 2019-07-07 DIAGNOSIS — E860 Dehydration: Secondary | ICD-10-CM | POA: Diagnosis not present

## 2019-07-07 DIAGNOSIS — Z20828 Contact with and (suspected) exposure to other viral communicable diseases: Secondary | ICD-10-CM | POA: Insufficient documentation

## 2019-07-07 DIAGNOSIS — R531 Weakness: Secondary | ICD-10-CM | POA: Insufficient documentation

## 2019-07-07 LAB — URINALYSIS, ROUTINE W REFLEX MICROSCOPIC
Bilirubin Urine: NEGATIVE
Glucose, UA: NEGATIVE mg/dL
Hgb urine dipstick: NEGATIVE
Ketones, ur: 20 mg/dL — AB
Leukocytes,Ua: NEGATIVE
Nitrite: NEGATIVE
Protein, ur: 30 mg/dL — AB
Specific Gravity, Urine: 1.028 (ref 1.005–1.030)
pH: 6 (ref 5.0–8.0)

## 2019-07-07 LAB — CBC
HCT: 42.8 % (ref 39.0–52.0)
Hemoglobin: 14.4 g/dL (ref 13.0–17.0)
MCH: 31.9 pg (ref 26.0–34.0)
MCHC: 33.6 g/dL (ref 30.0–36.0)
MCV: 94.7 fL (ref 80.0–100.0)
Platelets: 134 10*3/uL — ABNORMAL LOW (ref 150–400)
RBC: 4.52 MIL/uL (ref 4.22–5.81)
RDW: 12.8 % (ref 11.5–15.5)
WBC: 5.4 10*3/uL (ref 4.0–10.5)
nRBC: 0 % (ref 0.0–0.2)

## 2019-07-07 LAB — BASIC METABOLIC PANEL
Anion gap: 12 (ref 5–15)
BUN: 26 mg/dL — ABNORMAL HIGH (ref 6–20)
CO2: 31 mmol/L (ref 22–32)
Calcium: 9.4 mg/dL (ref 8.9–10.3)
Chloride: 103 mmol/L (ref 98–111)
Creatinine, Ser: 1.03 mg/dL (ref 0.61–1.24)
GFR calc Af Amer: >60 mL/min (ref >60)
GFR calc non Af Amer: >60 mL/min (ref >60)
Glucose, Bld: 92 mg/dL (ref 70–99)
Potassium: 3.5 mmol/L (ref 3.5–5.1)
Sodium: 146 mmol/L — ABNORMAL HIGH (ref 135–145)

## 2019-07-07 LAB — CBG MONITORING, ED: Glucose-Capillary: 88 mg/dL (ref 70–99)

## 2019-07-07 LAB — TROPONIN I (HIGH SENSITIVITY): Troponin I (High Sensitivity): 4 ng/L (ref <18)

## 2019-07-07 LAB — POC SARS CORONAVIRUS 2 AG -  ED: SARS Coronavirus 2 Ag: NEGATIVE

## 2019-07-07 MED ORDER — SODIUM CHLORIDE 0.9 % IV BOLUS
1000.0000 mL | Freq: Once | INTRAVENOUS | Status: AC
  Administered 2019-07-07: 1000 mL via INTRAVENOUS

## 2019-07-07 MED ORDER — SODIUM CHLORIDE 0.9% FLUSH: 3.0000 mL | Freq: Once | INTRAVENOUS | Status: DC

## 2019-07-07 NOTE — ED Provider Notes (Signed)
[facility] EMERGENCY DEPARTMENT Provider Note   CSN: [id number] Arrival date & time: 07/07/19  1100     History Chief Complaint  Patient presents with  . Weakness    Jeremy Wu is a 59 y.o. male brought to the ER from Jeremy [facility] for evaluation of increased weakness.  History is obtained from triage note and patient.  Per triage note patient's medications were recently changed 9 days ago and he has had weakness, decreased oral intake.  He was unable to walk at facility and referred to the ER for evaluation.  Patient is oriented to his name, hospital.  States he does not know why he is here and that "they just picked me up from my room and brought me here".  He states it is "2000" something but cannot tell me the year or month, but can tell me his birth date.  States he feels tired and weak.  He has not been very hungry and has not been wanting to eat lately.  He otherwise does not have any other complaints.  He specifically denies fevers, cough, chest pain, shortness of breath, vomiting, diarrhea, abdominal pain, dysuria.  States he usually walks on his own but lately he has not had energy to walk.  He is able to tell me where he lives and his brother's information.  Will attempt to contact brother and facility staff to obtain collateral information.  HPI     Past Medical History:  Diagnosis Date  . Anxiety   . GERD (gastroesophageal reflux disease)     Patient Active Problem List   Diagnosis Date Noted  . 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 Acute pancreatitis 11/29/2011  . Generalized anxiety disorder 11/29/2011    History reviewed. No pertinent surgical history.     No family history on file.  Social History   Tobacco Use  . Smoking status: Never Smoker  . Smokeless tobacco: Never Used  Substance Use Topics  . Alcohol use: No  . Drug use: No    Home Medications Prior to Admission  medications   Medication Sig Start Date End Date Taking? Authorizing Provider  acetaminophen (TYLENOL) 325 MG tablet Take 650 mg by mouth every 6 (six) hours as needed for mild pain, moderate pain or fever.   Yes [provider]  Cholecalciferol (VITAMIN D) 50 MCG (2000 UT) CAPS Take 2,000 Units by mouth daily.    Yes [provider]  cholestyramine (QUESTRAN) 4 g packet Take 2 g by mouth See admin instructions. Mix 2 grams in 8 oz of juice or water and drink by mouth on Monday, Wednesday and Friday at bedtime   Yes [provider]  diazepam (VALIUM) 5 MG tablet Take 5 mg by mouth 2 (two) times daily.   Yes [provider]  divalproex (DEPAKOTE) 125 MG DR tablet Take 125-250 mg by mouth See admin instructions. Take one tablet (125 mg) by mouth twice daily - 8am and 12pm and take two tablets (250 mg ) daily at 5pm 04/17/16  Yes [provider]  Ensure (ENSURE) Take 1 Can by mouth daily at 2 PM. chocolate   Yes [provider]  escitalopram (LEXAPRO) 10 MG tablet Take 15 mg by mouth daily.   Yes [provider]  famotidine (PEPCID) 10 MG tablet Take 10 mg by mouth 2 (two) times daily as needed for heartburn or indigestion.   Yes [provider]  lisinopril (ZESTRIL)  2.5 MG tablet Take 2.5 mg by mouth daily.  05/16/16  Yes [provider]  loperamide (IMODIUM A-D) 2 MG tablet Take 2 mg by mouth 4 (four) times daily as needed for diarrhea or loose stools.   Yes [provider]  meclizine (ANTIVERT) 25 MG tablet Take 25 mg by mouth daily as needed for dizziness (vertigo).   Yes [provider]  nystatin (NYSTATIN) powder Apply 1 application topically See admin instructions. Apply topically to right abdomen/groin three times daily as needed for redness/rash   Yes [provider]  oxybutynin (DITROPAN-XL) 10 MG 24 hr tablet Take 10 mg by mouth daily. 05/16/16  Yes [provider]  QUEtiapine  (SEROQUEL) 100 MG tablet Take 100 mg by mouth 3 (three) times daily.  05/16/16  Yes [provider]  vitamin B-12 (CYANOCOBALAMIN) 500 MCG tablet Take 500 mcg by mouth daily.   Yes [provider]    Allergies    Sulfa antibiotics  Review of Systems   Review of Systems  Constitutional: Positive for appetite change.  Neurological: Positive for weakness (generalized).  All other systems reviewed and are negative.   Physical Exam Updated Vital Signs BP (!) 105/59   Pulse 73   Temp 98.2 F (36.8 C) (Oral)   Resp 12   Ht 6' (1.829 m)   SpO2 99%   BMI 25.65 kg/m   Physical Exam Vitals and nursing note reviewed.  Constitutional:      General: He is not in acute distress.    Appearance: He is well-developed.     Comments: Awake, alert.  Appears older than stated age, blunt affect.  Nontoxic.  HENT:     Head: Normocephalic and atraumatic.     Comments: No signs of facial or scalp tenderness or signs of injury.    Right Ear: External ear normal.     Left Ear: External ear normal.     Nose: Nose normal.     Mouth/Throat:     Comments: Lips are dry and cracked but mucous membranes are moist. Eyes:     General: No scleral icterus.    Conjunctiva/sclera: Conjunctivae normal.  Cardiovascular:     Rate and Rhythm: Normal rate and regular rhythm.     Heart sounds: Normal heart sounds.     Comments: No lower extremity edema.  No calf tenderness.  1+ radial and DP pulses bilaterally. Pulmonary:     Effort: Pulmonary effort is normal.     Breath sounds: Normal breath sounds.  Abdominal:     Palpations: Abdomen is soft.     Tenderness: There is no abdominal tenderness.  Musculoskeletal:        General: No deformity. Normal range of motion.     Cervical back: Normal range of motion and neck supple.     Comments: Patient able to sit up, stand and ambulate in the room.  He is slow to move and walk but able to stand, walk without assistance.    Skin:    General:  Skin is warm and dry.     Capillary Refill: Capillary refill takes less than 2 seconds.  Neurological:     Mental Status: He is alert and oriented to person, place, and time.     Comments:   Mental Status: Patient is awake, alert, oriented to person, place, year.  Unable to recall exact reason for ER visit.  Speech is fluent and clear without dysarthria or aphasia.  No signs of neglect.  Cranial Nerves: I not tested II visual fields not tested. PERRL.   III, IV, VI EOMs intact without ptosis or diplopia  V sensation to light touch intact in all 3 divisions of trigeminal nerve bilaterally  VII facial movements symmetric bilaterally VIII hearing intact to voice/conversation  IX, X no uvula deviation, symmetric rise of soft palate/uvula XI 5/5 SCM and trapezius strength bilaterally  XII tongue protrusion midline, symmetric L/R movements  Motor: Strength 5/5 in upper/lower extremities .   Sensation to light touch intact in face, upper/lower extremities. No pronator drift. No leg drop.  Cerebellar: No ataxia with finger to nose. Left arm tremor noted with movement, no tremor at rest. Slow wide stance but steady gait.   Psychiatric:        Behavior: Behavior normal.        Thought Content: Thought content normal.        Judgment: Judgment normal.     ED Results / Procedures / Treatments   Labs (all labs ordered are listed, but only abnormal results are displayed) Labs Reviewed  BASIC METABOLIC PANEL - Abnormal; Notable for the following components:      Result Value   Sodium 146 (*)    BUN 26 (*)    All other components within normal limits  CBC - Abnormal; Notable for the following components:   Platelets 134 (*)    All other components within normal limits  URINALYSIS, ROUTINE W REFLEX MICROSCOPIC - Abnormal; Notable for the following components:   Color, Urine AMBER (*)    Ketones, ur 20 (*)    Protein, ur 30 (*)    Bacteria, UA RARE (*)    All other components within  normal limits  SARS CORONAVIRUS 2 (TAT 6-24 HRS)  CBG MONITORING, ED  POC SARS CORONAVIRUS 2 AG -  ED  TROPONIN I (HIGH SENSITIVITY)  TROPONIN I (HIGH SENSITIVITY)    EKG EKG Interpretation  Date/Time:  Monday July 07 2019 16:46:29 EST Ventricular Rate:  74 PR Interval:  156 QRS Duration: 186 QT Interval:  453 QTC Calculation: 503 R Axis:   98 Text Interpretation: Sinus rhythm RBBB and LPFB Confirmed by Tilden Fossa [phone]) on 07/07/2019 4:50:22 PM   Radiology CT Head Wo Contrast  Result Date: 07/07/2019 CLINICAL DATA:  Generalized weakness EXAM: CT HEAD WITHOUT CONTRAST TECHNIQUE: Contiguous axial images were obtained from the base of the skull through the vertex without intravenous contrast. COMPARISON:  06/09/2019 FINDINGS: Brain: No evidence of acute infarction, hemorrhage, hydrocephalus, extra-axial collection or mass lesion/mass effect. Vascular: No hyperdense vessel or unexpected calcification. Skull: Normal. Negative for fracture or focal lesion. Sinuses/Orbits: Opacification of the right maxillary antrum is noted slightly increased when compared with the prior exam. Other: None IMPRESSION: Chronic sinus disease.  No acute abnormality noted. Electronically Signed   By: Alcide Clever M.D.   On: 07/07/2019 17:18   DG Chest Portable 1 View  Result Date: 07/07/2019 CLINICAL DATA:  Weakness EXAM: PORTABLE CHEST 1 VIEW COMPARISON:  06/09/2016 FINDINGS: Cardiac shadows within normal limits. The lungs are well aerated bilaterally. No focal infiltrate or sizable effusion is seen. No bony abnormality is noted. IMPRESSION: No active disease. Electronically Signed   By: Alcide Clever M.D.   On: 07/07/2019 21:04    Procedures Procedures (including critical care time)  Medications Ordered in ED Medications  sodium chloride flush (NS) 0.9 % injection 3 mL (has no administration in time range)  sodium chloride 0.9 % bolus 1,000 mL (0 mLs  Intravenous Stopped 07/07/19 2352)    ED Course    I have reviewed the triage vital signs and the nursing notes.  Pertinent labs & imaging results that were available during my care of the patient were reviewed by me and considered in my medical decision making (see chart for details).    MDM Rules/Calculators/A&P                      EMR reviewed to obtain pertinent PMH but not much available.  I contacted [facility] [employer] and spoke to [employer].  States last time she saw patient was on Friday.  That day she noticed "white discharge from his anus" while giving him a bath that was dripping down his leg.  When this CNA arrived to facility today she was told that patient had been sent to the ER for not eating for the last few days and losing weight.  Patient was noted to be "leaning" to one side and needed help walking.  CNA was not present when patient was sent to the ER by EMS and cannot give further details.  Does state that some of his medicines were changed recently but she does not know which ones. Patient ambulates on his own at baseline.   I called brother Jeremy Wu [phone] 3 different times and left one voicemail to attempt to obtain more collateral information but unable to reach him.  Clinically, patient is nontoxic appearing.  He appears slightly dehydrated, older than stated age.  SBP's in the low 100s but good maps.  He has no complaints.  Overall appears to be decent historian.  Given nonspecific complaint of weakness per staff and limited past medical history on EMR, infectious work-up initiated.  ER work-up personally reviewed.  Shows hypernatremia, slightly elevated BUN, ketonuria.  This supports mild dehydration which fits clinical picture.  Staff at nursing facility states patient has been wanting to eat and drink less.  Patient was given IV fluids. Normal WBC. Normal Hgb.   No infection on urinalysis or chest x-ray.  POC Covid test is negative, confirmatory test pending.  EKG shows right bundle branch block and no  previous to compare.  Patient denies chest pain.  Initial troponin is undetectable.  Overall clinical picture is not consistent with ACS and I do not think delta troponin is necessary.  RN and myself ambulated patient here and he did not need any assistance walking.  He is tolerating fluids.  He has no neuro deficits.  Initial head CT obtained in triage is nonacute.  Stroke was considered highly unlikely given benign exam.  Cause of reported generalized weakness is unclear however extensive ER work up today is reassuring.  I don't think further emergent work up is initiated.  Per triage note pt recently changed medicines, ?Lexapro and this could be contributing.    Will dc back to [facility].  AVS includes discharge instructions, close monitoring of symptoms that can point or localize cause of weakness. Recommended re-evaluation by clinician in 2 days.  Final Clinical Impression(s) / ED Diagnoses Final diagnoses:  Dehydration  Generalized weakness    Rx / DC Orders ED Discharge Orders    None       Arlean Hopping 07/08/19 0019    Quintella Reichert, MD 07/10/19 [phone]

## 2019-07-07 NOTE — ED Triage Notes (Signed)
PTAR with patient from [facility] with c/o increase in generalized weakness since medications were changed 06/27/2019. Since the increase in Lexapro pt with weakness, decreased intake, non ambulatory which is different from baseline. In wheelchair at triage and oriented to questions.

## 2019-07-07 NOTE — ED Notes (Signed)
Pt remains alert and oriented x's 3.  Only c/o feeling weak

## 2019-07-07 NOTE — Discharge Instructions (Signed)
Jeremy Wu was seen in the ER for weakness, difficulty walking  Extensive work-up in the ER is overall reassuring and no source of weakness was found  Lab work showed mild dehydration but no other abnormalities.  Chest x-ray was normal.  Urinalysis did not reveal a urine infection.  Covid test here is negative, we have sent confirmatory Covid test to rule out this infection which should be back in the next 24 hours.  Head CT was obtained initially due to unclear source of weakness and this was normal.  He did not have any abnormalities on his neurological exam, he was ambulatory and tolerating fluids without any issues.  ED staff attempted to contact brother 3 times and left a voicemail, unable to contact brother to provide update prior to discharge  At this time cause of weakness is unclear.  Some of his weakness may be due to recent medication changes 5 days ago.  We were unable to confirm what medicines were changed.  We recommend close monitoring for any symptoms that may develop over the next few days that can point to a source.  We recommend follow-up by facility clinician in the next 2 to 3 days to monitor for clinical decline.  Return to the ER for one-sided weakness or any other stroke symptoms, fever, cough, vomiting, diarrhea or any other concerns.

## 2019-07-07 NOTE — ED Notes (Signed)
CBG Results of 88 reported to Selena Batten, Charity fundraiser.

## 2019-07-08 LAB — SARS CORONAVIRUS 2 (TAT 6-24 HRS): SARS Coronavirus 2: NEGATIVE

## 2019-07-08 NOTE — ED Notes (Signed)
PTAR called to transport 

## 2019-07-08 NOTE — ED Notes (Signed)
PTAR called to transport pt back to [facility]

## 2019-07-08 NOTE — ED Notes (Signed)
Report called to Sharia Reeve at [facility]

## 2020-01-01 ENCOUNTER — Emergency Department (HOSPITAL_COMMUNITY): Payer: BC Managed Care – PPO

## 2020-01-01 ENCOUNTER — Encounter (HOSPITAL_COMMUNITY): Payer: Self-pay | Admitting: Family Medicine

## 2020-01-01 ENCOUNTER — Inpatient Hospital Stay (HOSPITAL_COMMUNITY)
Admission: EM | Admit: 2020-01-01 | Discharge: 2020-01-06 | DRG: 871 | Disposition: A | Discharge status: Home Health | Payer: BC Managed Care – PPO | Source: Skilled Nursing Facility | Attending: Family Medicine | Admitting: Family Medicine

## 2020-01-01 ENCOUNTER — Other Ambulatory Visit: Payer: Self-pay

## 2020-01-01 DIAGNOSIS — D696 Thrombocytopenia, unspecified: Secondary | ICD-10-CM | POA: Diagnosis present

## 2020-01-01 DIAGNOSIS — E860 Dehydration: Secondary | ICD-10-CM | POA: Diagnosis present

## 2020-01-01 DIAGNOSIS — I130 Hypertensive heart and chronic kidney disease with heart failure and stage 1 through stage 4 chronic kidney disease, or unspecified chronic kidney disease: Secondary | ICD-10-CM | POA: Diagnosis present

## 2020-01-01 DIAGNOSIS — Z20822 Contact with and (suspected) exposure to covid-19: Secondary | ICD-10-CM | POA: Diagnosis present

## 2020-01-01 DIAGNOSIS — E7220 Disorder of urea cycle metabolism, unspecified: Secondary | ICD-10-CM | POA: Diagnosis present

## 2020-01-01 DIAGNOSIS — I951 Orthostatic hypotension: Secondary | ICD-10-CM

## 2020-01-01 DIAGNOSIS — D7589 Other specified diseases of blood and blood-forming organs: Secondary | ICD-10-CM | POA: Diagnosis present

## 2020-01-01 DIAGNOSIS — R6521 Severe sepsis with septic shock: Secondary | ICD-10-CM | POA: Diagnosis present

## 2020-01-01 DIAGNOSIS — K219 Gastro-esophageal reflux disease without esophagitis: Secondary | ICD-10-CM | POA: Diagnosis present

## 2020-01-01 DIAGNOSIS — I4510 Unspecified right bundle-branch block: Secondary | ICD-10-CM | POA: Diagnosis present

## 2020-01-01 DIAGNOSIS — R778 Other specified abnormalities of plasma proteins: Secondary | ICD-10-CM | POA: Diagnosis not present

## 2020-01-01 DIAGNOSIS — F411 Generalized anxiety disorder: Secondary | ICD-10-CM | POA: Diagnosis present

## 2020-01-01 DIAGNOSIS — J690 Pneumonitis due to inhalation of food and vomit: Secondary | ICD-10-CM | POA: Diagnosis present

## 2020-01-01 DIAGNOSIS — I509 Heart failure, unspecified: Secondary | ICD-10-CM | POA: Diagnosis present

## 2020-01-01 DIAGNOSIS — T445X5A Adverse effect of predominantly beta-adrenoreceptor agonists, initial encounter: Secondary | ICD-10-CM | POA: Diagnosis present

## 2020-01-01 DIAGNOSIS — R7989 Other specified abnormal findings of blood chemistry: Secondary | ICD-10-CM

## 2020-01-01 DIAGNOSIS — N189 Chronic kidney disease, unspecified: Secondary | ICD-10-CM | POA: Diagnosis present

## 2020-01-01 DIAGNOSIS — W19XXXA Unspecified fall, initial encounter: Secondary | ICD-10-CM

## 2020-01-01 DIAGNOSIS — E872 Acidosis: Secondary | ICD-10-CM | POA: Diagnosis present

## 2020-01-01 DIAGNOSIS — I2721 Secondary pulmonary arterial hypertension: Secondary | ICD-10-CM | POA: Diagnosis present

## 2020-01-01 DIAGNOSIS — F250 Schizoaffective disorder, bipolar type: Secondary | ICD-10-CM | POA: Diagnosis present

## 2020-01-01 DIAGNOSIS — A419 Sepsis, unspecified organism: Secondary | ICD-10-CM | POA: Diagnosis present

## 2020-01-01 DIAGNOSIS — R931 Abnormal findings on diagnostic imaging of heart and coronary circulation: Secondary | ICD-10-CM | POA: Diagnosis not present

## 2020-01-01 DIAGNOSIS — K7460 Unspecified cirrhosis of liver: Secondary | ICD-10-CM

## 2020-01-01 DIAGNOSIS — R42 Dizziness and giddiness: Secondary | ICD-10-CM | POA: Diagnosis not present

## 2020-01-01 DIAGNOSIS — Q211 Atrial septal defect: Secondary | ICD-10-CM | POA: Diagnosis not present

## 2020-01-01 DIAGNOSIS — E876 Hypokalemia: Secondary | ICD-10-CM | POA: Diagnosis present

## 2020-01-01 DIAGNOSIS — R9431 Abnormal electrocardiogram [ECG] [EKG]: Secondary | ICD-10-CM | POA: Diagnosis not present

## 2020-01-01 DIAGNOSIS — J189 Pneumonia, unspecified organism: Secondary | ICD-10-CM

## 2020-01-01 DIAGNOSIS — N179 Acute kidney failure, unspecified: Secondary | ICD-10-CM | POA: Diagnosis present

## 2020-01-01 DIAGNOSIS — I517 Cardiomegaly: Secondary | ICD-10-CM | POA: Diagnosis not present

## 2020-01-01 DIAGNOSIS — Y95 Nosocomial condition: Secondary | ICD-10-CM

## 2020-01-01 DIAGNOSIS — F332 Major depressive disorder, recurrent severe without psychotic features: Secondary | ICD-10-CM | POA: Diagnosis present

## 2020-01-01 HISTORY — DX: Unspecified right bundle-branch block: I45.10

## 2020-01-01 HISTORY — DX: Depression, unspecified: F32.A

## 2020-01-01 HISTORY — DX: Essential (primary) hypertension: I10

## 2020-01-01 LAB — CBC WITH DIFFERENTIAL/PLATELET
Abs Immature Granulocytes: 0.03 10*3/uL (ref 0.00–0.07)
Basophils Absolute: 0 10*3/uL (ref 0.0–0.1)
Basophils Relative: 0 %
Eosinophils Absolute: 0 10*3/uL (ref 0.0–0.5)
Eosinophils Relative: 0 %
HCT: 35.4 % — ABNORMAL LOW (ref 39.0–52.0)
Hemoglobin: 11.7 g/dL — ABNORMAL LOW (ref 13.0–17.0)
Immature Granulocytes: 0 %
Lymphocytes Relative: 12 %
Lymphs Abs: 0.8 10*3/uL (ref 0.7–4.0)
MCH: 32.1 pg (ref 26.0–34.0)
MCHC: 33.1 g/dL (ref 30.0–36.0)
MCV: 97 fL (ref 80.0–100.0)
Monocytes Absolute: 1.3 10*3/uL — ABNORMAL HIGH (ref 0.1–1.0)
Monocytes Relative: 19 %
Neutro Abs: 4.6 10*3/uL (ref 1.7–7.7)
Neutrophils Relative %: 69 %
Platelets: 83 10*3/uL — ABNORMAL LOW (ref 150–400)
RBC: 3.65 MIL/uL — ABNORMAL LOW (ref 4.22–5.81)
RDW: 13.7 % (ref 11.5–15.5)
WBC: 6.8 10*3/uL (ref 4.0–10.5)
nRBC: 0 % (ref 0.0–0.2)

## 2020-01-01 LAB — COMPREHENSIVE METABOLIC PANEL
ALT: 10 U/L (ref 0–44)
AST: 20 U/L (ref 15–41)
Albumin: 3.3 g/dL — ABNORMAL LOW (ref 3.5–5.0)
Alkaline Phosphatase: 34 U/L — ABNORMAL LOW (ref 38–126)
Anion gap: 8 (ref 5–15)
BUN: 22 mg/dL — ABNORMAL HIGH (ref 6–20)
CO2: 26 mmol/L (ref 22–32)
Calcium: 7.9 mg/dL — ABNORMAL LOW (ref 8.9–10.3)
Chloride: 109 mmol/L (ref 98–111)
Creatinine, Ser: 1.82 mg/dL — ABNORMAL HIGH (ref 0.61–1.24)
GFR calc Af Amer: 46 mL/min — ABNORMAL LOW (ref >60)
GFR calc non Af Amer: 40 mL/min — ABNORMAL LOW (ref >60)
Glucose, Bld: 101 mg/dL — ABNORMAL HIGH (ref 70–99)
Potassium: 4 mmol/L (ref 3.5–5.1)
Sodium: 143 mmol/L (ref 135–145)
Total Bilirubin: 0.9 mg/dL (ref 0.3–1.2)
Total Protein: 5.6 g/dL — ABNORMAL LOW (ref 6.5–8.1)

## 2020-01-01 LAB — URINALYSIS, ROUTINE W REFLEX MICROSCOPIC
Bacteria, UA: NONE SEEN
Bilirubin Urine: NEGATIVE
Glucose, UA: NEGATIVE mg/dL
Hgb urine dipstick: NEGATIVE
Ketones, ur: NEGATIVE mg/dL
Leukocytes,Ua: NEGATIVE
Nitrite: NEGATIVE
Protein, ur: 30 mg/dL — AB
Specific Gravity, Urine: 1.021 (ref 1.005–1.030)
pH: 5 (ref 5.0–8.0)

## 2020-01-01 LAB — PROTIME-INR
INR: 1.4 — ABNORMAL HIGH (ref 0.8–1.2)
Prothrombin Time: 16.4 seconds — ABNORMAL HIGH (ref 11.4–15.2)

## 2020-01-01 LAB — VALPROIC ACID LEVEL: Valproic Acid Lvl: 25 ug/mL — ABNORMAL LOW (ref 50.0–100.0)

## 2020-01-01 LAB — LIPASE, BLOOD: Lipase: 18 U/L (ref 11–51)

## 2020-01-01 LAB — LACTIC ACID, PLASMA
Lactic Acid, Venous: 3.1 mmol/L (ref 0.5–1.9)
Lactic Acid, Venous: 2.8 mmol/L (ref 0.5–1.9)

## 2020-01-01 LAB — TROPONIN I (HIGH SENSITIVITY)
Troponin I (High Sensitivity): 109 ng/L (ref <18)
Troponin I (High Sensitivity): 120 ng/L (ref <18)

## 2020-01-01 LAB — SARS CORONAVIRUS 2 BY RT PCR (HOSPITAL ORDER, PERFORMED IN ~~LOC~~ HOSPITAL LAB): SARS Coronavirus 2: NEGATIVE

## 2020-01-01 LAB — TSH: TSH: 1.09 u[IU]/mL (ref 0.350–4.500)

## 2020-01-01 LAB — BRAIN NATRIURETIC PEPTIDE: B Natriuretic Peptide: 277.1 pg/mL — ABNORMAL HIGH (ref 0.0–100.0)

## 2020-01-01 LAB — HIV ANTIBODY (ROUTINE TESTING W REFLEX): HIV Screen 4th Generation wRfx: NONREACTIVE

## 2020-01-01 MED ORDER — DRONABINOL 2.5 MG PO CAPS: 2.5000 mg | ORAL_CAPSULE | Freq: Every day | ORAL | Status: DC

## 2020-01-01 MED ORDER — FAMOTIDINE 20 MG PO TABS: 10.0000 mg | ORAL_TABLET | Freq: Two times a day (BID) | ORAL | Status: DC | PRN

## 2020-01-01 MED ORDER — BISACODYL 10 MG RE SUPP: 10.0000 mg | Freq: Every day | RECTAL | Status: DC | PRN

## 2020-01-01 MED ORDER — ONDANSETRON HCL 4 MG PO TABS: 4.0000 mg | ORAL_TABLET | Freq: Four times a day (QID) | ORAL | Status: DC | PRN

## 2020-01-01 MED ORDER — QUETIAPINE FUMARATE 100 MG PO TABS
100.0000 mg | ORAL_TABLET | Freq: Three times a day (TID) | ORAL | Status: DC
  Administered 2020-01-01 – 2020-01-06 (×15): 100 mg via ORAL
  Filled 2020-01-01 (×15): qty 1

## 2020-01-01 MED ORDER — SODIUM CHLORIDE 0.9 % IV SOLN: INTRAVENOUS | Status: DC

## 2020-01-01 MED ORDER — SODIUM CHLORIDE 0.9 % IV BOLUS
500.0000 mL | Freq: Once | INTRAVENOUS | Status: AC
  Administered 2020-01-01: 500 mL via INTRAVENOUS

## 2020-01-01 MED ORDER — SODIUM CHLORIDE 0.9 % IV BOLUS
1000.0000 mL | Freq: Once | INTRAVENOUS | Status: AC
  Administered 2020-01-01: 1000 mL via INTRAVENOUS

## 2020-01-01 MED ORDER — OXYBUTYNIN CHLORIDE ER 5 MG PO TB24
10.0000 mg | ORAL_TABLET | Freq: Every day | ORAL | Status: DC
  Administered 2020-01-01 – 2020-01-06 (×6): 10 mg via ORAL
  Filled 2020-01-01 (×6): qty 2

## 2020-01-01 MED ORDER — SENNOSIDES-DOCUSATE SODIUM 8.6-50 MG PO TABS: 1.0000 | ORAL_TABLET | Freq: Every evening | ORAL | Status: DC | PRN

## 2020-01-01 MED ORDER — DIVALPROEX SODIUM 125 MG PO DR TAB
125.0000 mg | DELAYED_RELEASE_TABLET | Freq: Two times a day (BID) | ORAL | Status: DC
  Administered 2020-01-02 – 2020-01-03 (×4): 125 mg via ORAL
  Filled 2020-01-01 (×4): qty 1

## 2020-01-01 MED ORDER — ACETAMINOPHEN 325 MG PO TABS
650.0000 mg | ORAL_TABLET | Freq: Four times a day (QID) | ORAL | Status: DC | PRN
  Administered 2020-01-02: 650 mg via ORAL
  Filled 2020-01-01: qty 2

## 2020-01-01 MED ORDER — DIVALPROEX SODIUM 250 MG PO DR TAB
250.0000 mg | DELAYED_RELEASE_TABLET | Freq: Every day | ORAL | Status: DC
  Administered 2020-01-01 – 2020-01-02 (×2): 250 mg via ORAL
  Filled 2020-01-01 (×2): qty 1

## 2020-01-01 MED ORDER — DOCUSATE SODIUM 100 MG PO CAPS
100.0000 mg | ORAL_CAPSULE | Freq: Two times a day (BID) | ORAL | Status: DC
  Administered 2020-01-01: 100 mg via ORAL
  Filled 2020-01-01 (×2): qty 1

## 2020-01-01 MED ORDER — ACETAMINOPHEN 650 MG RE SUPP: 650.0000 mg | Freq: Four times a day (QID) | RECTAL | Status: DC | PRN

## 2020-01-01 MED ORDER — ESCITALOPRAM OXALATE 10 MG PO TABS
15.0000 mg | ORAL_TABLET | Freq: Every day | ORAL | Status: DC
  Administered 2020-01-01 – 2020-01-06 (×6): 15 mg via ORAL
  Filled 2020-01-01 (×6): qty 2

## 2020-01-01 MED ORDER — CHOLESTYRAMINE 4 G PO PACK
2.0000 g | PACK | ORAL | Status: DC
  Administered 2020-01-02 – 2020-01-05 (×2): 2 g via ORAL
  Filled 2020-01-01 (×2): qty 1

## 2020-01-01 MED ORDER — DIVALPROEX SODIUM 125 MG PO DR TAB: 125.0000 mg | DELAYED_RELEASE_TABLET | ORAL | Status: DC

## 2020-01-01 MED ORDER — MECLIZINE HCL 25 MG PO TABS: 25.0000 mg | ORAL_TABLET | Freq: Every day | ORAL | Status: DC | PRN

## 2020-01-01 MED ORDER — ONDANSETRON HCL 4 MG/2ML IJ SOLN: 4.0000 mg | Freq: Four times a day (QID) | INTRAMUSCULAR | Status: DC | PRN

## 2020-01-01 NOTE — ED Notes (Signed)
Called [facility] it is confimed assisted living. Staff not availible to speak on baseline condition

## 2020-01-01 NOTE — ED Notes (Signed)
Pt and all belongings transported upstairs.  

## 2020-01-01 NOTE — Plan of Care (Signed)
  Problem: Education: Goal: Knowledge of General Education information will improve Description Including pain rating scale, medication(s)/side effects and non-pharmacologic comfort measures Outcome: Progressing   

## 2020-01-01 NOTE — ED Triage Notes (Addendum)
Pt arrived via [facility] from [facility] unwitnessed fall, gen weakness, AMS. Staff reports found in room on floor unknown time. Per staff less alerted than normal and ambulatory at baseline. PT denies blood thinners. Per EMS no obvious deformities, C collar in place   18LAC 1500 NS Given in route   Hx GERD, depression, dibetess,HTN, Alzheimer

## 2020-01-01 NOTE — H&P (Signed)
History and Physical    Jeremy Wu HCW:[id number] DOB: 1961/01/23 DOA: 01/01/2020  PCP: System, Provider Not In   Patient coming from: Assisted living  I have personally briefly reviewed patient's old medical records in [facility]  Chief Complaint: Lightheadedness.  HPI: Jeremy Wu is a 59 y.o. male with medical history significant of hypertension, depression, anxiety, GERD lives at assisted living presents in the emergency department with generalized weakness and lightheadedness. Patient reports that he was getting up to go to the activity room.  When he stood up he got very weak and fell down.  Patient was found by staff on the ground.  He reported he was feeling very weak and was not able to stand. Patient denies any pain,  he denies any head injury or LOC. He denies neck pain.  He denies chest pain or shortness of breath.  He denies lower extremity pain.  Patient  reports having productive cough which developed 2 days ago.  He reports he has had diarrhea for several days.  He denies that he had a loss of appetite.  He reports poor appetite.  ED Course: he was hypotensive other vitals were stable. HR 85, RR 16 , BP 75/55 which improved to 107/60  after2 liters of fluid bolus, O2 saturation 100% Labs: CBC: WBC 6.8, hemoglobin 11.7, hematocrit 35.4, MCV 97, platelet 87, sodium 143, potassium 4.0, chloride 109, bicarb 26, BUN 22, creatinine 1.80, glucose 101, calcium 7.9, alkaline phosphatase 34, albumin 3.3, lipase 18, AST 20, ALT 10, total bilirubin 0.9 BNP 277, troponin I 09-120, lactic acid 3.1 improved to 2.8 CT head and C-spine: No acute intracranial abnormality.)  Negative for cervical spine Fracture.   Review of Systems: As per HPI otherwise 10 point review of systems negative.  Review of Systems  Constitutional: Negative.   HENT: Negative.   Respiratory: Positive for cough.   Cardiovascular: Negative.   Gastrointestinal: Negative.   Genitourinary: Negative.     Musculoskeletal: Negative.   Skin: Negative.   Neurological: Positive for dizziness and weakness.  Psychiatric/Behavioral: Negative.     Past Medical History:  Diagnosis Date  . Anxiety   . GERD (gastroesophageal reflux disease)     History reviewed. No pertinent surgical history.   reports that he has never smoked. He has never used smokeless tobacco. He reports that he does not drink alcohol and does not use drugs.  Allergies  Allergen Reactions  . Sulfa Antibiotics Nausea And Vomiting    History reviewed. No pertinent family history. Family history reviewed and not pertinent .  Prior to Admission medications   Medication Sig Start Date End Date Taking? Authorizing Provider  acetaminophen (TYLENOL) 325 MG tablet Take 650 mg by mouth every 6 (six) hours as needed for mild pain, moderate pain or fever.   Yes [provider]  cholestyramine (QUESTRAN) 4 g packet Take 2 g by mouth See admin instructions. Mix 2 grams in 8 oz of juice or water and drink by mouth on Monday, Wednesday and Friday at bedtime   Yes [provider]  diazepam (VALIUM) 5 MG tablet Take 5 mg by mouth 2 (two) times daily.   Yes [provider]  divalproex (DEPAKOTE) 125 MG DR tablet Take 125-250 mg by mouth See admin instructions. Take one tablet (125 mg) by mouth twice daily - 8am and 12pm and take two tablets (250 mg ) daily at 5pm 04/17/16  Yes [provider]  dronabinol (MARINOL) 2.5 MG capsule Take  2.5 mg by mouth daily before supper.   Yes [provider]  Ensure (ENSURE) Take 1 Can by mouth daily at 2 PM. chocolate   Yes [provider]  escitalopram (LEXAPRO) 10 MG tablet Take 15 mg by mouth daily.   Yes [provider]  famotidine (PEPCID) 10 MG tablet Take 10 mg by mouth 2 (two) times daily as needed for heartburn or indigestion.   Yes [provider]  lisinopril (ZESTRIL) 2.5 MG tablet Take 2.5 mg by mouth daily.  05/16/16  Yes  [provider]  loperamide (IMODIUM A-D) 2 MG tablet Take 2 mg by mouth 4 (four) times daily as needed for diarrhea or loose stools.   Yes [provider]  meclizine (ANTIVERT) 25 MG tablet Take 25 mg by mouth daily as needed for dizziness (vertigo).   Yes [provider]  nystatin (NYSTATIN) powder Apply 1 application topically See admin instructions. Apply topically to right abdomen/groin three times daily as needed for redness/rash   Yes [provider]  oxybutynin (DITROPAN-XL) 10 MG 24 hr tablet Take 10 mg by mouth daily. 05/16/16  Yes [provider]  QUEtiapine (SEROQUEL) 100 MG tablet Take 100 mg by mouth 3 (three) times daily.  05/16/16  Yes [provider]  vitamin B-12 (CYANOCOBALAMIN) 500 MCG tablet Take 500 mcg by mouth daily.   Yes [provider]    Physical Exam: Vitals:   01/01/20 1431 01/01/20 1500 01/01/20 1530 01/01/20 1535  BP:  107/72 107/61 107/61  Pulse: 80 86 81 85  Resp:  18 18 18   Temp:      TempSrc:      SpO2: 98% 100% 95% 100%    Constitutional: NAD, calm, comfortable Vitals:   01/01/20 1431 01/01/20 1500 01/01/20 1530 01/01/20 1535  BP:  107/72 107/61 107/61  Pulse: 80 86 81 85  Resp:  18 18 18   Temp:      TempSrc:      SpO2: 98% 100% 95% 100%   Eyes: PERRL, lids and conjunctivae normal ENMT: Mucous membranes are moist. Posterior pharynx clear of any exudate or lesions.Normal dentition.  Neck: normal, supple, no masses, no thyromegaly Respiratory: clear to auscultation bilaterally, no wheezing, no crackles. Normal respiratory effort. No accessory muscle use.  Cardiovascular: Regular rate and rhythm, no murmurs / rubs / gallops. No extremity edema. 2+ pedal pulses. No carotid bruits.  Abdomen: no tenderness, no masses palpated. No hepatosplenomegaly. Bowel sounds positive.  Musculoskeletal: no clubbing / cyanosis. No joint deformity upper and lower extremities. Good ROM, no contractures.  Normal muscle tone.  Skin: no rashes, lesions, ulcers. No induration Neurologic: CN 2-12 grossly intact. Sensation intact, DTR normal. Strength 5/5 in all 4.  Psychiatric: Normal judgment and insight. Alert and oriented x 3. Normal mood.    Labs on Admission: I have personally reviewed following labs and imaging studies  CBC: Recent Labs  Lab 01/01/20 1108  WBC 6.8  NEUTROABS 4.6  HGB 11.7*  HCT 35.4*  MCV 97.0  PLT 83*   Basic Metabolic Panel: Recent Labs  Lab 01/01/20 1108  NA 143  K 4.0  CL 109  CO2 26  GLUCOSE 101*  BUN 22*  CREATININE 1.82*  CALCIUM 7.9*   GFR: CrCl cannot be calculated (Unknown ideal weight.). Liver Function Tests: Recent Labs  Lab 01/01/20 1108  AST 20  ALT 10  ALKPHOS 34*  BILITOT 0.9  PROT 5.6*  ALBUMIN 3.3*   Recent Labs  Lab 01/01/20  1108  LIPASE 18   No results for input(s): AMMONIA in the last 168 hours. Coagulation Profile: Recent Labs  Lab 01/01/20 1108  INR 1.4*   Cardiac Enzymes: No results for input(s): CKTOTAL, CKMB, CKMBINDEX, TROPONINI in the last 168 hours. BNP (last 3 results) No results for input(s): PROBNP in the last 8760 hours. HbA1C: No results for input(s): HGBA1C in the last 72 hours. CBG: No results for input(s): GLUCAP in the last 168 hours. Lipid Profile: No results for input(s): CHOL, HDL, LDLCALC, TRIG, CHOLHDL, LDLDIRECT in the last 72 hours. Thyroid Function Tests: No results for input(s): TSH, T4TOTAL, FREET4, T3FREE, THYROIDAB in the last 72 hours. Anemia Panel: No results for input(s): VITAMINB12, FOLATE, FERRITIN, TIBC, IRON, RETICCTPCT in the last 72 hours. Urine analysis:    Component Value Date/Time   COLORURINE AMBER (A) 07/07/2019 1850   APPEARANCEUR CLEAR 07/07/2019 1850   LABSPEC 1.028 07/07/2019 1850   PHURINE 6.0 07/07/2019 1850   GLUCOSEU NEGATIVE 07/07/2019 1850   HGBUR NEGATIVE 07/07/2019 1850   BILIRUBINUR NEGATIVE 07/07/2019 1850   KETONESUR 20 (A) 07/07/2019 1850     PROTEINUR 30 (A) 07/07/2019 1850   UROBILINOGEN 2.0 (H) 12/01/2011 1602   NITRITE NEGATIVE 07/07/2019 1850   LEUKOCYTESUR NEGATIVE 07/07/2019 1850    Radiological Exams on Admission: CT Head Wo Contrast  Result Date: 01/01/2020 CLINICAL DATA:  Fall.  Head injury.  Headache. EXAM: CT HEAD WITHOUT CONTRAST CT CERVICAL SPINE WITHOUT CONTRAST TECHNIQUE: Multidetector CT imaging of the head and cervical spine was performed following the standard protocol without intravenous contrast. Multiplanar CT image reconstructions of the cervical spine were also generated. COMPARISON:  CT head 07/07/2019 FINDINGS: CT HEAD FINDINGS Brain: No evidence of acute infarction, hemorrhage, hydrocephalus, extra-axial collection or mass lesion/mass effect. Vascular: Negative for hyperdense vessel Skull: Negative Sinuses/Orbits: Mild mucosal edema paranasal sinuses. Negative orbit. Other: None CT CERVICAL SPINE FINDINGS Alignment: Mild retrolisthesis C3-4 and C4-5 Skull base and vertebrae: Negative for fracture Soft tissues and spinal canal: No soft tissue swelling or mass. Disc levels: Mild disc and facet degeneration at C4-5. Disc degeneration and uncinate spurring C5-6 and C6-7. No significant stenosis. Upper chest: Lung apices clear bilaterally. Other: None IMPRESSION: 1. No acute intracranial abnormality 2. Negative for cervical spine fracture. Electronically Signed   By: Marlan Palauharles  Clark M.D.   On: 01/01/2020 12:56   CT Cervical Spine Wo Contrast  Result Date: 01/01/2020 CLINICAL DATA:  Fall.  Head injury.  Headache. EXAM: CT HEAD WITHOUT CONTRAST CT CERVICAL SPINE WITHOUT CONTRAST TECHNIQUE: Multidetector CT imaging of the head and cervical spine was performed following the standard protocol without intravenous contrast. Multiplanar CT image reconstructions of the cervical spine were also generated. COMPARISON:  CT head 07/07/2019 FINDINGS: CT HEAD FINDINGS Brain: No evidence of acute infarction, hemorrhage, hydrocephalus,  extra-axial collection or mass lesion/mass effect. Vascular: Negative for hyperdense vessel Skull: Negative Sinuses/Orbits: Mild mucosal edema paranasal sinuses. Negative orbit. Other: None CT CERVICAL SPINE FINDINGS Alignment: Mild retrolisthesis C3-4 and C4-5 Skull base and vertebrae: Negative for fracture Soft tissues and spinal canal: No soft tissue swelling or mass. Disc levels: Mild disc and facet degeneration at C4-5. Disc degeneration and uncinate spurring C5-6 and C6-7. No significant stenosis. Upper chest: Lung apices clear bilaterally. Other: None IMPRESSION: 1. No acute intracranial abnormality 2. Negative for cervical spine fracture. Electronically Signed   By: Marlan Palauharles  Clark M.D.   On: 01/01/2020 12:56   DG Chest Port 1 View  Result Date: 01/01/2020 CLINICAL DATA:  Increasing weakness. EXAM: PORTABLE CHEST 1 VIEW COMPARISON:  Single-view of the chest 07/07/2019. FINDINGS: Lungs clear. Heart size normal. No pneumothorax or pleural fluid. No acute or focal bony abnormality. IMPRESSION: Negative chest. Electronically Signed   By: Drusilla Kanner M.D.   On: 01/01/2020 12:29    EKG: Abnormal at bundle branch block, sinus arrhythmia  Assessment/Plan Principal Problem:   Intermittent lightheadedness Active Problems:   Generalized anxiety disorder   MDD (major depressive disorder), recurrent severe, without psychosis (HCC)   Generalized weakness could be secondary to dehydration: Patient was hypotensive on arrival, blood pressure improved with IV fluids. CT head/ CT C-spine negative for acute abnormality.  admit to observation. Continue gentle hydration/ IV fluids 100 cc. PT/OT evaluation.  Acute kidney injury on CKD: Creatinine on admission 1.89, Creatinine 1.2, continue IV fluids for hydration Avoid nephrotoxic medications, recheck labs tomorrow morning  Elevated troponins: Patient denies any chest pain, SOB EKG shows right bundle branch block, sinus arrhythmia Trop slightly  elevated ,Continue to trend troponin Cardiology consult.  Hypertension: Hold lisinopril for now. Resume once renal functions improves.   Major depression: Continue Lexapro  DVT prophylaxis: SCDs, Thrombocytopenia Code Status: Full Family Communication: No one at bedside Disposition Plan: D/C back to assisted living Consults called: Consider cardiology Admission status: Admitted inpatient   Cipriano Bunker MD [facility]   If 7PM-7AM, please contact night-coverage [email or web address]   01/01/2020, 3:48 PM

## 2020-01-01 NOTE — ED Notes (Signed)
Pt transported to CT ?

## 2020-01-01 NOTE — ED Provider Notes (Addendum)
[facility]-EMERGENCY DEPT Provider Note   CSN: [id number] Arrival date & time: 01/01/20  1055     History Chief Complaint  Patient presents with  . Hypotension    Jeremy Wu is a 59 y.o. male.  HPI Patient reports that he was getting up to go to the activity room.  He lives at [facility] assisted living.  He reports that since he started to stand up he got very weak and recognize that he was falling down.  Patient was found by staff on the ground.  They were unsure his total downtime.  Estimated not to be long as the patient had been seen earlier in the day.  Patient denies he has any areas of pain.  He did say he struck his head but denies he has any headache.  He denies neck pain.  He denies chest pain or shortness of breath.  He denies lower extremity pain.  Patient has a very audible wet cough.  He reports that is been there for about 2 days now.  No fever that he knows of.  Patient reports he does frequently have problems with getting diarrhea.  He reports he has had diarrhea for several days.  He denies that he had a loss of appetite.  He reports he has continued to eat.    Past Medical History:  Diagnosis Date  . Anxiety   . GERD (gastroesophageal reflux disease)     Patient Active Problem List   Diagnosis Date Noted  . 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 Acute pancreatitis 11/29/2011  . Generalized anxiety disorder 11/29/2011    No past surgical history on file.     No family history on file.  Social History   Tobacco Use  . Smoking status: Never Smoker  . Smokeless tobacco: Never Used  Substance Use Topics  . Alcohol use: No  . Drug use: No    Home Medications Prior to Admission medications   Medication Sig Start Date End Date Taking? Authorizing Provider  acetaminophen (TYLENOL) 325 MG tablet Take 650 mg by mouth every 6 (six) hours as needed for mild pain, moderate pain or  fever.   Yes [provider]  cholestyramine (QUESTRAN) 4 g packet Take 2 g by mouth See admin instructions. Mix 2 grams in 8 oz of juice or water and drink by mouth on Monday, Wednesday and Friday at bedtime   Yes [provider]  diazepam (VALIUM) 5 MG tablet Take 5 mg by mouth 2 (two) times daily.   Yes [provider]  divalproex (DEPAKOTE) 125 MG DR tablet Take 125-250 mg by mouth See admin instructions. Take one tablet (125 mg) by mouth twice daily - 8am and 12pm and take two tablets (250 mg ) daily at 5pm 04/17/16  Yes [provider]  dronabinol (MARINOL) 2.5 MG capsule Take 2.5 mg by mouth daily before supper.   Yes [provider]  Ensure (ENSURE) Take 1 Can by mouth daily at 2 PM. chocolate   Yes [provider]  escitalopram (LEXAPRO) 10 MG tablet Take 15 mg by mouth daily.   Yes [provider]  famotidine (PEPCID) 10 MG tablet Take 10 mg by mouth 2 (two) times daily as needed for heartburn or indigestion.   Yes [provider]  lisinopril (ZESTRIL) 2.5 MG tablet Take 2.5 mg by mouth daily.  05/16/16  Yes [provider]  loperamide (IMODIUM A-D) 2 MG  tablet Take 2 mg by mouth 4 (four) times daily as needed for diarrhea or loose stools.   Yes [provider]  meclizine (ANTIVERT) 25 MG tablet Take 25 mg by mouth daily as needed for dizziness (vertigo).   Yes [provider]  nystatin (NYSTATIN) powder Apply 1 application topically See admin instructions. Apply topically to right abdomen/groin three times daily as needed for redness/rash   Yes [provider]  oxybutynin (DITROPAN-XL) 10 MG 24 hr tablet Take 10 mg by mouth daily. 05/16/16  Yes [provider]  QUEtiapine (SEROQUEL) 100 MG tablet Take 100 mg by mouth 3 (three) times daily.  05/16/16  Yes [provider]  vitamin B-12 (CYANOCOBALAMIN) 500 MCG tablet Take 500 mcg by mouth daily.   Yes [provider]    Allergies    Sulfa antibiotics  Review of Systems   Review of Systems 10 systems are reviewed and negative except as per HPI Physical Exam Updated Vital Signs BP (!) 100/59   Pulse 76   Temp 97.8 F (36.6 C) (Oral)   Resp 12   SpO2 99%   Physical Exam Constitutional:      Comments: Patient is awake.  He is answering questions.  He appears cognitively slowed.  No respiratory  HENT:     Head: Normocephalic and atraumatic.     Mouth/Throat:     Mouth: Mucous membranes are moist.     Pharynx: Oropharynx is clear.  Eyes:     Extraocular Movements: Extraocular movements intact.     Conjunctiva/sclera: Conjunctivae normal.     Pupils: Pupils are equal, round, and reactive to light.  Neck:     Comments: No bony point tenderness to the C-spine. Cardiovascular:     Rate and Rhythm: Normal rate and regular rhythm.     Pulses: Normal pulses.     Heart sounds: Normal heart sounds.  Pulmonary:     Comments: Intermittent wet cough.  Scattered rhonchi.  Clears with cough.  No wheezing or crackles.  Denies chest wall pain to compression Abdominal:     General: There is no distension.     Palpations: Abdomen is soft.     Tenderness: There is no abdominal tenderness. There is no guarding.  Musculoskeletal:        General: Normal range of motion.     Cervical back: Neck supple.     Comments: I can put both lower extremities for deep flexion and hip pushes against resistance without low back or limb pain.  No deformities of the upper extremities.  No significant peripheral edema.  Patient does seem to have general muscular atrophy.  Skin:    General: Skin is warm and dry.  Neurological:     Comments: Patient is awake and answering questions.  He does seem cognitively slowed.  He is giving history.  He will follow commands to perform grip strength bilateral upper extremities and push against resistance of the lower extremities.  He seems slightly slow and weaker globally  than expected for age.  Psychiatric:     Comments: Affect is flat.     ED Results / Procedures / Treatments   Labs (all labs ordered are listed, but only abnormal results are displayed) Labs Reviewed  COMPREHENSIVE METABOLIC PANEL - Abnormal; Notable for the following components:      Result Value   Glucose, Bld 101 (*)    BUN 22 (*)    Creatinine, Ser 1.82 (*)    Calcium  7.9 (*)    Total Protein 5.6 (*)    Albumin 3.3 (*)    Alkaline Phosphatase 34 (*)    GFR calc non Af Amer 40 (*)    GFR calc Af Amer 46 (*)    All other components within normal limits  BRAIN NATRIURETIC PEPTIDE - Abnormal; Notable for the following components:   B Natriuretic Peptide 277.1 (*)    All other components within normal limits  LACTIC ACID, PLASMA - Abnormal; Notable for the following components:   Lactic Acid, Venous 3.1 (*)    All other components within normal limits  CBC WITH DIFFERENTIAL/PLATELET - Abnormal; Notable for the following components:   RBC 3.65 (*)    Hemoglobin 11.7 (*)    HCT 35.4 (*)    Platelets 83 (*)    Monocytes Absolute 1.3 (*)    All other components within normal limits  PROTIME-INR - Abnormal; Notable for the following components:   Prothrombin Time 16.4 (*)    INR 1.4 (*)    All other components within normal limits  VALPROIC ACID LEVEL - Abnormal; Notable for the following components:   Valproic Acid Lvl 25 (*)    All other components within normal limits  TROPONIN I (HIGH SENSITIVITY) - Abnormal; Notable for the following components:   Troponin I (High Sensitivity) 109 (*)    All other components within normal limits  LIPASE, BLOOD  LACTIC ACID, PLASMA  URINALYSIS, ROUTINE W REFLEX MICROSCOPIC  TROPONIN I (HIGH SENSITIVITY)    EKG EKG Interpretation  Date/Time:  Thursday January 01 2020 11:12:02 EDT Ventricular Rate:  89 PR Interval:    QRS Duration: 160 QT Interval:  399 QTC Calculation: 486 R Axis:   88 Text Interpretation: Sinus rhythm Right  bundle branch block no sig change from previouis Confirmed by Arby Barrette [phone]) on 01/01/2020 1:31:18 PM   Radiology CT Head Wo Contrast  Result Date: 01/01/2020 CLINICAL DATA:  Fall.  Head injury.  Headache. EXAM: CT HEAD WITHOUT CONTRAST CT CERVICAL SPINE WITHOUT CONTRAST TECHNIQUE: Multidetector CT imaging of the head and cervical spine was performed following the standard protocol without intravenous contrast. Multiplanar CT image reconstructions of the cervical spine were also generated. COMPARISON:  CT head 07/07/2019 FINDINGS: CT HEAD FINDINGS Brain: No evidence of acute infarction, hemorrhage, hydrocephalus, extra-axial collection or mass lesion/mass effect. Vascular: Negative for hyperdense vessel Skull: Negative Sinuses/Orbits: Mild mucosal edema paranasal sinuses. Negative orbit. Other: None CT CERVICAL SPINE FINDINGS Alignment: Mild retrolisthesis C3-4 and C4-5 Skull base and vertebrae: Negative for fracture Soft tissues and spinal canal: No soft tissue swelling or mass. Disc levels: Mild disc and facet degeneration at C4-5. Disc degeneration and uncinate spurring C5-6 and C6-7. No significant stenosis. Upper chest: Lung apices clear bilaterally. Other: None IMPRESSION: 1. No acute intracranial abnormality 2. Negative for cervical spine fracture. Electronically Signed   By: Marlan Palau M.D.   On: 01/01/2020 12:56   CT Cervical Spine Wo Contrast  Result Date: 01/01/2020 CLINICAL DATA:  Fall.  Head injury.  Headache. EXAM: CT HEAD WITHOUT CONTRAST CT CERVICAL SPINE WITHOUT CONTRAST TECHNIQUE: Multidetector CT imaging of the head and cervical spine was performed following the standard protocol without intravenous contrast. Multiplanar CT image reconstructions of the cervical spine were also generated. COMPARISON:  CT head 07/07/2019 FINDINGS: CT HEAD FINDINGS Brain: No evidence of acute infarction, hemorrhage, hydrocephalus, extra-axial collection or mass lesion/mass effect. Vascular:  Negative for hyperdense vessel Skull: Negative Sinuses/Orbits: Mild mucosal edema paranasal sinuses. Negative  orbit. Other: None CT CERVICAL SPINE FINDINGS Alignment: Mild retrolisthesis C3-4 and C4-5 Skull base and vertebrae: Negative for fracture Soft tissues and spinal canal: No soft tissue swelling or mass. Disc levels: Mild disc and facet degeneration at C4-5. Disc degeneration and uncinate spurring C5-6 and C6-7. No significant stenosis. Upper chest: Lung apices clear bilaterally. Other: None IMPRESSION: 1. No acute intracranial abnormality 2. Negative for cervical spine fracture. Electronically Signed   By: Marlan Palau M.D.   On: 01/01/2020 12:56   DG Chest Port 1 View  Result Date: 01/01/2020 CLINICAL DATA:  Increasing weakness. EXAM: PORTABLE CHEST 1 VIEW COMPARISON:  Single-view of the chest 07/07/2019. FINDINGS: Lungs clear. Heart size normal. No pneumothorax or pleural fluid. No acute or focal bony abnormality. IMPRESSION: Negative chest. Electronically Signed   By: Drusilla Kanner M.D.   On: 01/01/2020 12:29    Procedures Procedures (including critical care time) CRITICAL CARE Performed by: Arby Barrette   Total critical care time: 30 minutes  Critical care time was exclusive of separately billable procedures and treating other patients.  Critical care was necessary to treat or prevent imminent or life-threatening deterioration.  Critical care was time spent personally by me on the following activities: development of treatment plan with patient and/or surrogate as well as nursing, discussions with consultants, evaluation of patient's response to treatment, examination of patient, obtaining history from patient or surrogate, ordering and performing treatments and interventions, ordering and review of laboratory studies, ordering and review of radiographic studies, pulse oximetry and re-evaluation of patient's condition. Medications Ordered in ED Medications  sodium chloride 0.9  % bolus 500 mL (0 mLs Intravenous Stopped 01/01/20 1239)  sodium chloride 0.9 % bolus 1,000 mL (1,000 mLs Intravenous New Bag/Given 01/01/20 1339)    ED Course  I have reviewed the triage vital signs and the nursing notes.  Pertinent labs & imaging results that were available during my care of the patient were reviewed by me and considered in my medical decision making (see chart for details).  Consult: Hospitalist for admission    MDM Rules/Calculators/A&P                          Patient is brought in after being found on the floor in his room by staff at [facility].  Patient describes collapsing to the floor due to general weakness.  He was found to be hypotensive.  Patient has had hypotension in the emergency department.  With fluid resuscitation blood pressures are improving.  Initial systolic blood pressures were in the 70s.  At this time, patient has had about 2 L of fluids and systolic blood pressure is at 400.  Patient has mild troponin elevation.  Will plan for admission to trend troponins and continue hydration.  Patient does not endorse any chest pain or shortness of breath.  Patient's oxygen saturations on room air are high 90s.  He does not exhibit any dyspnea.  Patient does have wet cough but chest x-ray does not show acute findings.  May be early pneumonia versus bronchitis.  At this time, without fever, finding on chest x-ray or leukocytosis, will defer antibiotics.  Patient will be in observation in the hospital for any signs of infectious etiology for antibiotic initiation. Final Clinical Impression(s) / ED Diagnoses Final diagnoses:  Dehydration  Fall, initial encounter  Orthostatic hypotension  Elevated troponin    Rx / DC Orders ED Discharge Orders    None  Arby BarrettePfeiffer, Destenee Guerry, MD 01/01/20 1504    Arby BarrettePfeiffer, Avacyn Kloosterman, MD 01/01/20 [phone]

## 2020-01-02 ENCOUNTER — Inpatient Hospital Stay (HOSPITAL_COMMUNITY): Payer: BC Managed Care – PPO

## 2020-01-02 ENCOUNTER — Encounter (HOSPITAL_COMMUNITY): Payer: Self-pay | Admitting: Family Medicine

## 2020-01-02 DIAGNOSIS — R778 Other specified abnormalities of plasma proteins: Secondary | ICD-10-CM

## 2020-01-02 DIAGNOSIS — R931 Abnormal findings on diagnostic imaging of heart and coronary circulation: Secondary | ICD-10-CM

## 2020-01-02 DIAGNOSIS — R9431 Abnormal electrocardiogram [ECG] [EKG]: Secondary | ICD-10-CM

## 2020-01-02 LAB — COMPREHENSIVE METABOLIC PANEL
ALT: 10 U/L (ref 0–44)
AST: 15 U/L (ref 15–41)
Albumin: 3.1 g/dL — ABNORMAL LOW (ref 3.5–5.0)
Alkaline Phosphatase: 28 U/L — ABNORMAL LOW (ref 38–126)
Anion gap: 6 (ref 5–15)
BUN: 22 mg/dL — ABNORMAL HIGH (ref 6–20)
CO2: 25 mmol/L (ref 22–32)
Calcium: 8.2 mg/dL — ABNORMAL LOW (ref 8.9–10.3)
Chloride: 111 mmol/L (ref 98–111)
Creatinine, Ser: 0.89 mg/dL (ref 0.61–1.24)
GFR calc Af Amer: >60 mL/min (ref >60)
GFR calc non Af Amer: >60 mL/min (ref >60)
Glucose, Bld: 102 mg/dL — ABNORMAL HIGH (ref 70–99)
Potassium: 3.4 mmol/L — ABNORMAL LOW (ref 3.5–5.1)
Sodium: 142 mmol/L (ref 135–145)
Total Bilirubin: 1 mg/dL (ref 0.3–1.2)
Total Protein: 5.3 g/dL — ABNORMAL LOW (ref 6.5–8.1)

## 2020-01-02 LAB — CBC
HCT: 29.3 % — ABNORMAL LOW (ref 39.0–52.0)
Hemoglobin: 9.9 g/dL — ABNORMAL LOW (ref 13.0–17.0)
MCH: 31.8 pg (ref 26.0–34.0)
MCHC: 33.8 g/dL (ref 30.0–36.0)
MCV: 94.2 fL (ref 80.0–100.0)
Platelets: 77 10*3/uL — ABNORMAL LOW (ref 150–400)
RBC: 3.11 MIL/uL — ABNORMAL LOW (ref 4.22–5.81)
RDW: 13.7 % (ref 11.5–15.5)
WBC: 6.6 10*3/uL (ref 4.0–10.5)
nRBC: 0 % (ref 0.0–0.2)

## 2020-01-02 LAB — ECHOCARDIOGRAM COMPLETE
Height: 73 in
Weight: 2610.25 oz

## 2020-01-02 MED ORDER — IOHEXOL 350 MG/ML SOLN
100.0000 mL | Freq: Once | INTRAVENOUS | Status: AC | PRN
  Administered 2020-01-02: 100 mL via INTRAVENOUS

## 2020-01-02 MED ORDER — SODIUM CHLORIDE (PF) 0.9 % IJ SOLN
INTRAMUSCULAR | Status: AC
  Filled 2020-01-02: qty 50

## 2020-01-02 MED ORDER — VANCOMYCIN HCL 1250 MG/250ML IV SOLN
1250.0000 mg | Freq: Once | INTRAVENOUS | Status: AC
  Administered 2020-01-02: 1250 mg via INTRAVENOUS
  Filled 2020-01-02: qty 250

## 2020-01-02 MED ORDER — SODIUM CHLORIDE 0.9 % IV SOLN
2.0000 g | Freq: Three times a day (TID) | INTRAVENOUS | Status: DC
  Administered 2020-01-02 – 2020-01-06 (×13): 2 g via INTRAVENOUS
  Filled 2020-01-02 (×14): qty 2

## 2020-01-02 MED ORDER — VANCOMYCIN HCL IN DEXTROSE 1-5 GM/200ML-% IV SOLN
1000.0000 mg | Freq: Two times a day (BID) | INTRAVENOUS | Status: DC
  Administered 2020-01-02 – 2020-01-03 (×2): 1000 mg via INTRAVENOUS
  Filled 2020-01-02 (×2): qty 200

## 2020-01-02 MED ORDER — DIAZEPAM 2 MG PO TABS
2.0000 mg | ORAL_TABLET | Freq: Two times a day (BID) | ORAL | Status: DC
  Administered 2020-01-02 – 2020-01-06 (×9): 2 mg via ORAL
  Filled 2020-01-02 (×9): qty 1

## 2020-01-02 MED ORDER — MECLIZINE HCL 25 MG PO TABS: 12.5000 mg | ORAL_TABLET | Freq: Every day | ORAL | Status: DC | PRN

## 2020-01-02 NOTE — Progress Notes (Signed)
Pharmacy Antibiotic Note  Jeremy Wu is a 59 y.o. male admitted on 01/01/2020 with suspected sepsis.  Pharmacy has been consulted for vancomycin and cefepime dosing.  Plan:  Vancomycin 1250 mg IV now, then 1000 mg IV q12 hr  Measure vancomycin trough at steady state as indicated  SCr q48 while on vanc  Cefepime 2 g IV q8 hr   Height: 6\' 1"  (185.4 cm) Weight: 74 kg (163 lb 2.3 oz) IBW/kg (Calculated) : 79.9  Temp (24hrs), Avg:99.7 F (37.6 C), Min:97.6 F (36.4 C), Max:102.2 F (39 C)  Recent Labs  Lab 01/01/20 1108 01/01/20 1330 01/02/20 0445  WBC 6.8  --  6.6  CREATININE 1.82*  --  0.89  LATICACIDVEN 3.1* 2.8*  --     Estimated Creatinine Clearance: 93.5 mL/min (by C-G formula based on SCr of 0.89 mg/dL).    Allergies  Allergen Reactions  . Sulfa Antibiotics Nausea And Vomiting    Antimicrobials this admission: 7/2 vancomycin >>  7/2 cefepim >>   Dose adjustments this admission: n/a  Microbiology results: None yet  Thank you for allowing pharmacy to be a part of this patient's care.  Laurana Magistro A 01/02/2020 12:19 PM

## 2020-01-02 NOTE — Progress Notes (Addendum)
PROGRESS NOTE    Jeremy Wu  WCB:[id number] DOB: December 16, 1960 DOA: 01/01/2020 PCP: System, Provider Not In  Brief Narrative:  59 year old male resident of [facility] assisted [facility] ?  Cognitive dysfunction secondary to severe emotional issues and instability His brother only relative gives history that he has lived with his family all of his life-previously was living in [place] had to be transferred to a psych hospital and previously was at [facility] but did not do well at the independent living and was transferred to assisted living At baseline he is alert walks to breakfast exercises and rides around the bus and is able to voice his needs It is noted he has a prior admission 11/2011 for acute pancreatitis Generalized anxiety disorder  for which she is on several medications Came to [facility] long ED with generalized weakness-found down on the ground by staff Staff reports wet cough diarrhea for several days In ED found to have hypotension heart rate 85 BP 75/50 white count 6.8 BUN/creatinine 22/1.8 normal (normal baseline creatinine less than 1) BNP 277 Troponin I 09-->120 cardiology Lactic acid 3.1 cycling down to 2.8 Some thrombocytopenia   Assessment & Plan:   Principal Problem:   Intermittent lightheadedness Active Problems:   Generalized anxiety disorder   MDD (major depressive disorder), recurrent severe, without psychosis (HCC)   1. Sepsis 2/2 viral illness with cough/diarrhea?  DDx severe dehydration with volume depletion DDx autoimmune although latter is less likely 2. Thrombocytopenia-see below  a. No clear etiology at this time however T-max 102.2 night of admission 7/1 b. Empiric start cefepime vancomycin and get blood cultures x2 and UC [cath] c. If spikes fever check blood culture d. Lactic acidosis is improving with fluid resuscitation as is AKI 3. Elevated troponin chronic right bundle branch block on EKG 07/2019 a. EKG unchanged from admission on  repeat 7/2 AM b. Echocardiogram shows elevated right sided pressures-reasonable chest CT scan which will probably help determine if he has underlying pneumonia versus a PE 4. AKI on admission a. Likely related to use of lisinopril and potentially reported diarrhea-noticed he is on Questran in addition to Imodium- Imodium has been stopped b. Continue saline 100 cc/h c. Saline lock when eating 5. ?  Heart failure a. Per cardiologist 6. Anemia-likely dilutional in the setting of macrocytosis a. Thrombocytopenia-Baseline is 130s-?  Dilution but more concerning on chronic Depakote-causes myelodysplasia as well as tremors sometimes hyperammonemia and a variety of dyscrasias b. We will obtain an ammonia level in the morning given his tremors c. His INR is slightly elevated we will repeat in the morning d. Nonetheless we will keep on SCDs at this time while we rule out PE 7. Severe anxiety/behavioral issues necessitating institutionalization at a young age a. Depakote can cause hyperammonemia and a variety of dyscrasias b. We will obtain an ammonia level in the morning given his tremors c. We will need outpatient consideration for testing but in the interim I will discontinue meclizine, Marinol, and reassess d. For now can continue Lexapro, Seroquel 100 3 times daily and will resume at lower doses of Valium at 2.5 instead of five twice daily  DVT prophylaxis: SCD Code Status: Full Family Communication: Long discussion with brother at the bedside Disposition: Inpatient  Status is: Inpatient  Remains inpatient appropriate because:Hemodynamically unstable and IV treatments appropriate due to intensity of illness or inability to take PO   Dispo: The patient is from: ALF  Anticipated d/c is to: ALF              Anticipated d/c date is: 2 days              Patient currently is not medically stable to d/c.       Consultants:   Cardiology  Procedures:  IMPRESSIONS    1.  Right ventricular systolic function is mildly reduced. The right  ventricular size is severely enlarged. There is moderately elevated  pulmonary artery systolic pressure. The estimated right ventricular  systolic pressure is 47.7 mmHg.  2. Left ventricular ejection fraction, by estimation, is 60 to 65%. The  left ventricle has normal function. The left ventricle has no regional  wall motion abnormalities. Left ventricular diastolic parameters were  normal. There is the interventricular  septum is flattened in systole, consistent with right ventricular pressure  overload.  3. Right atrial size was severely dilated.  4. The mitral valve is normal in structure. Trivial mitral valve  regurgitation. No evidence of mitral stenosis.  5. The aortic valve was not well visualized. Aortic valve regurgitation  is not visualized. No aortic stenosis is present.  6. The inferior vena cava is dilated in size with <50% respiratory  variability, suggesting right atrial pressure of 15 mmHg.  7. Cannot exclude atrial level shunt with right to left shunt based on  image 70.  * Conclusion(s)/Recommendation(s): Severely dilated right atrium and right  ventricle. Consider CT angiography of pulmonary arteries to exclude PE,  and focused echo with agitated saline to fully evaluate atrial septum in  setting of right sided chamber  enlargement. Critical results communicated and acknowledged by Dr.  Christopher/Cardiology at 10:21 am 01/02/20.   Antimicrobials: Vancomycin cefepime started on 7/2   Subjective: Awake alert coherent but quite laconic Able to tell me with one-word answers is doing but not really communicative Cannot tell me where he lives Recalls that he fell at around 10 AM yesterday Cannot tell me about diarrhea cough or others  Objective: Vitals:   01/02/20 0219 01/02/20 0456 01/02/20 0500 01/02/20 0641  BP: (!) 112/57 (!) 94/51  (!) 95/54  Pulse: 85 76  72  Resp: 18 18  18   Temp:  (!) 102.2 F (39 C) 99.2 F (37.3 C)  97.6 F (36.4 C)  TempSrc: Oral Oral  Oral  SpO2: 93% 93%  93%  Weight:   74 kg   Height:        Intake/Output Summary (Last 24 hours) at 01/02/2020 0743 Last data filed at 01/02/2020 0600 Gross per 24 hour  Intake 4710 ml  Output 0 ml  Net 4710 ml   Filed Weights   01/01/20 1830 01/02/20 0500  Weight: 74.3 kg 74 kg    Examination:  General exam: Awake coherent flat affect some facial incongruity but no dissymmetry to smile Throat is clear Respiratory system: Clinically clear no added sound no rales no rhonchi Cardiovascular system: S1-S2 no murmur rub or gallop Gastrointestinal system: Abdomen soft nontender no rebound no guarding no hepatosplenomegaly. Central nervous system: Patient has some past pointing and dysmetria reflexes are brisk power is 5/5 in lower extremities external ocular movements are intact Extremities: No swelling Skin: Soft supple Psychiatry: Flat  Data Reviewed: I have personally reviewed following labs and imaging studies BUN/creatinine down from 22/1.8 on admission to 22/0.8 Potassium 3.4 Hemoglobin 9.9 down from 11.7 Platelet down from 83-77 INR is 1.4   Radiology Studies: CT Head Wo Contrast  Result Date: 01/01/2020  CLINICAL DATA:  Fall.  Head injury.  Headache. EXAM: CT HEAD WITHOUT CONTRAST CT CERVICAL SPINE WITHOUT CONTRAST TECHNIQUE: Multidetector CT imaging of the head and cervical spine was performed following the standard protocol without intravenous contrast. Multiplanar CT image reconstructions of the cervical spine were also generated. COMPARISON:  CT head 07/07/2019 FINDINGS: CT HEAD FINDINGS Brain: No evidence of acute infarction, hemorrhage, hydrocephalus, extra-axial collection or mass lesion/mass effect. Vascular: Negative for hyperdense vessel Skull: Negative Sinuses/Orbits: Mild mucosal edema paranasal sinuses. Negative orbit. Other: None CT CERVICAL SPINE FINDINGS Alignment: Mild retrolisthesis  C3-4 and C4-5 Skull base and vertebrae: Negative for fracture Soft tissues and spinal canal: No soft tissue swelling or mass. Disc levels: Mild disc and facet degeneration at C4-5. Disc degeneration and uncinate spurring C5-6 and C6-7. No significant stenosis. Upper chest: Lung apices clear bilaterally. Other: None IMPRESSION: 1. No acute intracranial abnormality 2. Negative for cervical spine fracture. Electronically Signed   By: Marlan Palau M.D.   On: 01/01/2020 12:56   CT Cervical Spine Wo Contrast  Result Date: 01/01/2020 CLINICAL DATA:  Fall.  Head injury.  Headache. EXAM: CT HEAD WITHOUT CONTRAST CT CERVICAL SPINE WITHOUT CONTRAST TECHNIQUE: Multidetector CT imaging of the head and cervical spine was performed following the standard protocol without intravenous contrast. Multiplanar CT image reconstructions of the cervical spine were also generated. COMPARISON:  CT head 07/07/2019 FINDINGS: CT HEAD FINDINGS Brain: No evidence of acute infarction, hemorrhage, hydrocephalus, extra-axial collection or mass lesion/mass effect. Vascular: Negative for hyperdense vessel Skull: Negative Sinuses/Orbits: Mild mucosal edema paranasal sinuses. Negative orbit. Other: None CT CERVICAL SPINE FINDINGS Alignment: Mild retrolisthesis C3-4 and C4-5 Skull base and vertebrae: Negative for fracture Soft tissues and spinal canal: No soft tissue swelling or mass. Disc levels: Mild disc and facet degeneration at C4-5. Disc degeneration and uncinate spurring C5-6 and C6-7. No significant stenosis. Upper chest: Lung apices clear bilaterally. Other: None IMPRESSION: 1. No acute intracranial abnormality 2. Negative for cervical spine fracture. Electronically Signed   By: Marlan Palau M.D.   On: 01/01/2020 12:56   DG Chest Port 1 View  Result Date: 01/01/2020 CLINICAL DATA:  Increasing weakness. EXAM: PORTABLE CHEST 1 VIEW COMPARISON:  Single-view of the chest 07/07/2019. FINDINGS: Lungs clear. Heart size normal. No pneumothorax  or pleural fluid. No acute or focal bony abnormality. IMPRESSION: Negative chest. Electronically Signed   By: Drusilla Kanner M.D.   On: 01/01/2020 12:29     Scheduled Meds: . cholestyramine  2 g Oral Once per day on Mon Wed Fri  . diazepam  2 mg Oral Q12H  . divalproex  125 mg Oral BID WC   And  . divalproex  250 mg Oral Q supper  . escitalopram  15 mg Oral Daily  . oxybutynin  10 mg Oral Daily  . QUEtiapine  100 mg Oral TID   Continuous Infusions: . sodium chloride 100 mL/hr at 01/01/20 1630     LOS: 1 day    Time spent: 32  Rhetta Mura, MD [facility] To contact the attending provider between 7A-7P or the covering provider during after hours 7P-7A, please log into the web site [email or web address] and access using universal [place] password for that web site. If you do not have the password, please call the hospital operator.  01/02/2020, 7:43 AM

## 2020-01-02 NOTE — TOC Initial Note (Signed)
Transition of Care [facility]) - Initial/Assessment Note    Patient Details  Name: Jeremy Wu MRN: [id number] Date of Birth: 05-10-1961  Transition of Care [facility]) CM/SW Contact:    Elliot Gault, LCSW Phone Number: 01/02/2020, 11:23 AM  Clinical Narrative:                  Pt admitted from [facility] at [facility]F. Plan is for return to the ALF at dc. Spoke with staff at the ALF today to update. They will accept pt back at dc. They do not have transportation so pt would need family transport if available. Pt receives min-mod assistance with ADLs and at baseline, he ambulates independently without any assistive device.  The ALF will need dc summary and FL2 faxed to them at [phone] prior to dc. Phone number to pt's unit is [phone].  DC timeframe not yet know. TOC will follow.  Expected Discharge Plan: Assisted Living Barriers to Discharge: Continued Medical Work up   Patient Goals and CMS Choice        Expected Discharge Plan and Services Expected Discharge Plan: Assisted Living In-house Referral: Clinical Social Work   Post Acute Care Choice: Resumption of Svcs/PTA Provider Living arrangements for the past 2 months: Assisted Living Facility                                      Prior Living Arrangements/Services Living arrangements for the past 2 months: Assisted Living Facility Lives with:: Facility Resident Patient language and need for interpreter reviewed:: Yes        Need for Family Participation in Patient Care: No (Comment) Care giver support system in place?: Yes (comment)   Criminal Activity/Legal Involvement Pertinent to Current Situation/Hospitalization: No - Comment as needed  Activities of Daily Living Home Assistive Devices/Equipment: Grab bars around toilet, Grab bars in shower, Hand-held shower hose, Blood pressure cuff, Scales, Walker (specify type) ADL Screening (condition at time of admission) Patient's cognitive ability  adequate to safely complete daily activities?: Yes Is the patient deaf or have difficulty hearing?: No Does the patient have difficulty seeing, even when wearing glasses/contacts?: No Does the patient have difficulty concentrating, remembering, or making decisions?: Yes Patient able to express need for assistance with ADLs?: Yes Does the patient have difficulty dressing or bathing?: Yes Independently performs ADLs?: No Communication: Independent Dressing (OT): Needs assistance Is this a change from baseline?: Pre-admission baseline Grooming: Needs assistance Is this a change from baseline?: Pre-admission baseline Feeding: Needs assistance Is this a change from baseline?: Pre-admission baseline Bathing: Needs assistance Is this a change from baseline?: Pre-admission baseline Toileting: Needs assistance Is this a change from baseline?: Pre-admission baseline In/Out Bed: Needs assistance Is this a change from baseline?: Pre-admission baseline Walks in Home: Needs assistance Is this a change from baseline?: Pre-admission baseline Does the patient have difficulty walking or climbing stairs?: Yes (secondary to weakness) Weakness of Legs: Both Weakness of Arms/Hands: None  Permission Sought/Granted                  Emotional Assessment       Orientation: : Oriented to Self, Oriented to Place, Oriented to  Time, Oriented to Situation Alcohol / Substance Use: Not Applicable Psych Involvement: No (comment)  Admission diagnosis:  Orthostatic hypotension [I95.1] Dehydration [E86.0] Elevated troponin [R77.8] Intermittent lightheadedness [R42] Fall, initial encounter [W19.XXXA] Patient Active Problem List   Diagnosis Date  Noted  . Intermittent lightheadedness 01/01/2020  . 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 Acute pancreatitis 11/29/2011  . Generalized anxiety disorder 11/29/2011   PCP:  System, Provider Not In Pharmacy:    [id number] [address] - [place], [place] - [address] [place] [address] Phone: [phone] Fax: [phone]     Social Determinants of Health (SDOH) Interventions    Readmission Risk Interventions Readmission Risk Prevention Plan 01/02/2020  Transportation Screening Complete  Medication Review (RN CM) Complete  Some recent data might be hidden

## 2020-01-02 NOTE — Evaluation (Signed)
Occupational Therapy Evaluation Patient Details Name: Jeremy Wu MRN: [id number] DOB: 1961-01-23 Today's Date: 01/02/2020    History of Present Illness Pt is a 59 y.o. male with medical history significant of hypertension, depression, anxiety, GERD lives at assisted living presents in the emergency department with generalized weakness and lightheadedness. Patient reports that he was getting up to go to the activity room.  When he stood up he got very weak and fell down.  Pt admitted with weakness, dehydration, hypotension, and AKI.  Troponins were mildly elevated - pt asymptomatic and evaluated by cardiology   Clinical Impression   Patient presents supine in bed with pt's brother present who, while observing pt's performance during evaluation reported that pt appears to be "about 15% below his usual".  Pt required Minimal assist for dynamic sitting balance while demonstrating lower body dressing techniques, with poor control and noted RT lateral, posterior and anterior losses of balance with need of assistance x 2 to correct.  Pt has good rehab potential and should benefit from continued skilled OT services in the acute setting as well as home health OT once pt returns to his [facility] ALF.      Follow Up Recommendations  Home health OT    Equipment Recommendations       Recommendations for Other Services       Precautions / Restrictions Precautions Precautions: Fall Restrictions Weight Bearing Restrictions: No      Mobility Bed Mobility Overal bed mobility: Needs Assistance Bed Mobility: Supine to Sit     Supine to sit: Supervision;HOB elevated        Transfers Overall transfer level: Needs assistance Equipment used: None Transfers: Sit to/from Stand Sit to Stand: Min guard         General transfer comment: min guard for safety    Balance Overall balance assessment: Needs assistance Sitting-balance support: Bilateral upper extremity supported;Feet  supported Sitting balance-Leahy Scale: Poor Sitting balance - Comments: Pt required UE support.  Tends to lean R - baseline.  With challenges (MMT) pt with posterior lean requiring assist and cues to correct. Postural control: Right lateral lean   Standing balance-Leahy Scale: Fair Standing balance comment: Pt was able to ambulate without AD.  Did have some stumbles with gait but recovered without assist                           ADL either performed or assessed with clinical judgement   ADL Overall ADL's : Needs assistance/impaired Eating/Feeding: Minimal assistance;Cueing for compensatory techinques Eating/Feeding Details (indicate cue type and reason): Pt eating slowly with LT hand with observed difficulty placing foot onto utensil.  Pt shown how to use knife to block food on plate, however pt did not raise RT hand to initiate, therefore min assist provided. Grooming: Minimal assistance Grooming Details (indicate cue type and reason): Seated and based on clinical judgment. Upper Body Bathing: Minimal assistance Upper Body Bathing Details (indicate cue type and reason): based on clinical judgment. Lower Body Bathing: Moderate assistance Lower Body Bathing Details (indicate cue type and reason): based on clinical judgment. Upper Body Dressing : Minimal assistance Upper Body Dressing Details (indicate cue type and reason): to don posterior gown while EOB. Lower Body Dressing: Minimal assistance Lower Body Dressing Details (indicate cue type and reason): Pt doffed socks EOB with Min As for balance.  Donned socks with Min As for balance as well as task completion. Anticipate Min-Mod As with standing LE dressing  based on clinical judgment. Toilet Transfer: Minimal Dentist Details (indicate cue type and reason): Based on transfers to recliner. Toileting- Clothing Manipulation and Hygiene: Minimal assistance Toileting - Clothing Manipulation Details (indicate cue  type and reason): based on clinical judgment.     Functional mobility during ADLs: Minimal assistance General ADL Comments: Pt's brother observing evaluation, and noting that pt is below baseline with functional mobility and ADLs.     Vision   Vision Assessment?: No apparent visual deficits     Perception     Praxis      Pertinent Vitals/Pain Pain Assessment: No/denies pain     Hand Dominance Left   Extremity/Trunk Assessment Upper Extremity Assessment Upper Extremity Assessment: Generalized weakness   Lower Extremity Assessment Lower Extremity Assessment: Overall WFL for tasks assessed (ROM WFL; MMT 5/5)   Cervical / Trunk Assessment Cervical / Trunk Assessment: Kyphotic;Other exceptions Cervical / Trunk Exceptions: leans R, forward head with R lateral lean - brother reports baseline   Communication Communication Communication: No difficulties   Cognition Arousal/Alertness: Awake/alert Behavior During Therapy: Flat affect (baseline per brother.) Overall Cognitive Status: History of cognitive impairments - at baseline                                 General Comments: Brother reports cognition is near baseline.  Pt was not able to state date and required increased time and repetition for commands.  Required multimodal cues at times.   General Comments  VSS.  O2 sats 100%; HR 70-80's bpm; BP sitting 100/55, standing 105/60.               Home Living Family/patient expects to be discharged to:: Assisted living (Pt from [facility], and been there ~10 years per pt's brother)                             Home Equipment: Grab bars - tub/shower          Prior Functioning/Environment Level of Independence: Needs assistance  Gait / Transfers Assistance Needed: Pt independent with community ambulation.  Denies falls other than fall that initiated this admission ADL's / Homemaking Assistance Needed: Reports independent with toileting and  dressing; had assist with showers   Comments: Brother present and provided/confirmed PLOF        OT Problem List: Decreased strength;Decreased coordination;Decreased safety awareness;Decreased activity tolerance;Impaired balance (sitting and/or standing)      OT Treatment/Interventions: Self-care/ADL training;Therapeutic exercise;Therapeutic activities;DME and/or AE instruction;Balance training;Patient/family education    OT Goals(Current goals can be found in the care plan section) Acute Rehab OT Goals Patient Stated Goal: return to ALF OT Goal Formulation: With patient/family Time For Goal Achievement: 01/16/20 Potential to Achieve Goals: Good  OT Frequency: Min 2X/week   Barriers to D/C:    No known       Co-evaluation PT/OT/SLP Co-Evaluation/Treatment: Yes Reason for Co-Treatment: For patient/therapist safety;Other (comment) (Admitted with lightheadedness) PT goals addressed during session: Mobility/safety with mobility OT goals addressed during session: ADL's and self-care      AM-PAC OT "6 Clicks" Daily Activity     Outcome Measure Help from another person eating meals?: A Little Help from another person taking care of personal grooming?: A Little Help from another person toileting, which includes using toliet, bedpan, or urinal?: A Little Help from another person bathing (including washing, rinsing, drying)?: None Help from another  person to put on and taking off regular upper body clothing?: A Little Help from another person to put on and taking off regular lower body clothing?: A Little 6 Click Score: 19   End of Session Equipment Utilized During Treatment: Gait belt Nurse Communication: Mobility status  Activity Tolerance: Patient tolerated treatment well Patient left: in chair;with call bell/phone within reach;with chair alarm set;with family/visitor present  OT Visit Diagnosis: History of falling (Z91.81);Dizziness and giddiness (R42)                Time:  1497-0263 OT Time Calculation (min): 34 min Charges:  OT Evaluation $OT Eval Low Complexity: 1 Low  Matisse Salais, OT Acute Rehab Services Office: [phone] 01/02/2020   Theodoro Clock 01/02/2020, 12:51 PM

## 2020-01-02 NOTE — Plan of Care (Signed)
  Problem: Clinical Measurements: Goal: Ability to maintain clinical measurements within normal limits will improve Outcome: Progressing Goal: Will remain free from infection Outcome: Progressing Goal: Diagnostic test results will improve Outcome: Progressing Goal: Respiratory complications will improve Outcome: Progressing Goal: Cardiovascular complication will be avoided Outcome: Progressing   Problem: Coping: Goal: Level of anxiety will decrease Outcome: Progressing   Problem: Elimination: Goal: Will not experience complications related to bowel motility Outcome: Progressing Goal: Will not experience complications related to urinary retention Outcome: Progressing   Problem: Pain Managment: Goal: General experience of comfort will improve Outcome: Progressing   Problem: Safety: Goal: Ability to remain free from injury will improve Outcome: Progressing   Problem: Skin Integrity: Goal: Risk for impaired skin integrity will decrease Outcome: Progressing   

## 2020-01-02 NOTE — Progress Notes (Signed)
   01/02/20 0219  Assess: MEWS Score  Temp (!) 102.2 F (39 C)  BP (!) 112/57  Pulse Rate 85  ECG Heart Rate 82  Resp 18  SpO2 93 %  O2 Device Room Air  Assess: MEWS Score  MEWS Temp 2  MEWS Systolic 0  MEWS Pulse 0  MEWS RR 0  MEWS LOC 0  MEWS Score 2  MEWS Score Color Yellow  Assess: if the MEWS score is Yellow or Red  Were vital signs taken at a resting state? Yes  Focused Assessment Documented focused assessment  Early Detection of Sepsis Score *See Row Information* Medium  MEWS guidelines implemented *See Row Information* Yes  Treat  MEWS Interventions Administered prn meds/treatments;Consulted Respiratory Therapy  Take Vital Signs  Increase Vital Sign Frequency  Yellow: Q 2hr X 2 then Q 4hr X 2, if remains yellow, continue Q 4hrs  Escalate  MEWS: Escalate Yellow: discuss with charge nurse/RN and consider discussing with provider and RRT  Notify: Charge Nurse/RN  Name of Charge Nurse/RN Notified Tori RN  Date Charge Nurse/RN Notified 01/02/20  Time Charge Nurse/RN Notified 0221  Notify: Provider  Provider Name/Title Linton Flemings  Date Provider Notified 01/02/20  Time Provider Notified [phone]  Notification Type Page  Notification Reason Change in status  Notify: Rapid Response  Name of Rapid Response RN Notified Alexis RN  Date Rapid Response Notified 01/02/20  Time Rapid Response Notified 0140  Document  Progress note created (see row info) Yes

## 2020-01-02 NOTE — Evaluation (Signed)
Physical Therapy Evaluation Patient Details Name: Jeremy Wu MRN: [id number] DOB: [birth year] Today's Date: 01/02/2020   History of Present Illness  Pt is a 59 y.o. male with medical history significant of hypertension, depression, anxiety, GERD lives at assisted living presents in the emergency department with generalized weakness and lightheadedness. Patient reports that he was getting up to go to the activity room.  When he stood up he got very weak and fell down.  Pt admitted with weakness, dehydration, hypotension, and AKI.  Troponins were mildly elevated - pt asymptomatic and evaluated by cardiology  Clinical Impression  Pt admitted with above diagnosis. Pt with mild instability with gait with multiple LOB but able to recover on his own.  Pt does lean to the right and with quite flat affect, which, brother reports is baseline.  Pt is from ALF and is functioning near baseline. Will benefit from PT and HHPT to progress mobility and safety to his baseline level. Pt currently with functional limitations due to the deficits listed below (see PT Problem List). Pt will benefit from skilled PT to increase their independence and safety with mobility to allow discharge to the venue listed below.       Follow Up Recommendations Home health PT;Supervision for mobility/OOB (Return to ALF with HHPT)    Equipment Recommendations  None recommended by PT    Recommendations for Other Services       Precautions / Restrictions Precautions Precautions: Fall      Mobility  Bed Mobility Overal bed mobility: Needs Assistance Bed Mobility: Supine to Sit     Supine to sit: Supervision;HOB elevated        Transfers Overall transfer level: Needs assistance Equipment used: None Transfers: Sit to/from Stand Sit to Stand: Min guard         General transfer comment: min guard for safety  Ambulation/Gait Ambulation/Gait assistance: Min guard Gait Distance (Feet): 300 Feet Assistive  device: None Gait Pattern/deviations: Step-through pattern;Staggering left;Scissoring;Narrow base of support Gait velocity: decreased   General Gait Details: Pt with internal rotation and toe in on R with occasional scissor gait from R leg causing staggering.  Pt had 3-4 LOB but was able to recover without assist.  Brother reports some instability at baseline, but does appear slightly worse now.  Cues for posture  Stairs            Wheelchair Mobility    Modified Rankin (Stroke Patients Only)       Balance Overall balance assessment: Needs assistance Sitting-balance support: Bilateral upper extremity supported;Feet supported Sitting balance-Leahy Scale: Poor Sitting balance - Comments: Pt required UE support.  Tends to lean R - baseline.  With challenges (MMT) pt with posterior lean requiring assist and cues to correct.     Standing balance-Leahy Scale: Fair Standing balance comment: Pt was able to ambulate without AD.  Did have some stumbles with gait but recovered without assist                             Pertinent Vitals/Pain Pain Assessment: No/denies pain    Home Living Family/patient expects to be discharged to:: Assisted living (Pt from [facility])               Home Equipment: Grab bars - tub/shower      Prior Function Level of Independence: Needs assistance   Gait / Transfers Assistance Needed: Pt independent with community ambulation.  Denies falls other than fall  that initiated this admission  ADL's / Homemaking Assistance Needed: Reports independent with toileting and dressing; had assist with showers  Comments: Brother present and provided/confirmed PLOF     Hand Dominance   Dominant Hand: Left    Extremity/Trunk Assessment   Upper Extremity Assessment Upper Extremity Assessment: Defer to OT evaluation    Lower Extremity Assessment Lower Extremity Assessment: Overall WFL for tasks assessed (ROM WFL; MMT 5/5)    Cervical /  Trunk Assessment Cervical / Trunk Assessment: Kyphotic;Other exceptions Cervical / Trunk Exceptions: leans R, forward head with R lateral lean - brother reports baseline  Communication   Communication: No difficulties  Cognition Arousal/Alertness: Awake/alert Behavior During Therapy: Flat affect (brother reports baseline) Overall Cognitive Status: History of cognitive impairments - at baseline                                 General Comments: Brother reports cognition is near baseline.  Pt was not able to state date and required increased time and repetition for commands.  Required multimodal cues at times.      General Comments General comments (skin integrity, edema, etc.): VSS.  O2 sats 100%; HR 70-80's bpm; BP sitting 100/55, standing 105/60.    Exercises     Assessment/Plan    PT Assessment Patient needs continued PT services  PT Problem List Decreased strength;Decreased mobility;Decreased safety awareness;Decreased range of motion;Decreased knowledge of precautions;Decreased activity tolerance;Decreased balance;Decreased knowledge of use of DME       PT Treatment Interventions DME instruction;Therapeutic activities;Gait training;Therapeutic exercise;Patient/family education;Balance training;Functional mobility training;Neuromuscular re-education    PT Goals (Current goals can be found in the Care Plan section)  Acute Rehab PT Goals Patient Stated Goal: return to ALF PT Goal Formulation: With patient/family Time For Goal Achievement: 01/16/20 Potential to Achieve Goals: Good    Frequency Min 3X/week   Barriers to discharge        Co-evaluation PT/OT/SLP Co-Evaluation/Treatment: Yes Reason for Co-Treatment: For patient/therapist safety (admitted with lightheadness - seen as cotx for safety) PT goals addressed during session: Mobility/safety with mobility OT goals addressed during session: ADL's and self-care       AM-PAC PT "6 Clicks" Mobility   Outcome Measure Help needed turning from your back to your side while in a flat bed without using bedrails?: None Help needed moving from lying on your back to sitting on the side of a flat bed without using bedrails?: None Help needed moving to and from a bed to a chair (including a wheelchair)?: None Help needed standing up from a chair using your arms (e.g., wheelchair or bedside chair)?: None Help needed to walk in hospital room?: A Little Help needed climbing 3-5 steps with a railing? : A Little 6 Click Score: 22    End of Session Equipment Utilized During Treatment: Gait belt Activity Tolerance: Patient tolerated treatment well Patient left: with chair alarm set;with family/visitor present;with call bell/phone within reach;in chair Nurse Communication: Mobility status PT Visit Diagnosis: Unsteadiness on feet (R26.81);Muscle weakness (generalized) (M62.81)    Time: 5643-3295 PT Time Calculation (min) (ACUTE ONLY): 35 min   Charges:   PT Evaluation $PT Eval Moderate Complexity: 1 Andi Hence, PT Acute Rehab Services Pager [phone] [facility] [phone]    Rayetta Humphrey 01/02/2020, 11:16 AM

## 2020-01-02 NOTE — Progress Notes (Addendum)
Cardiology Consultation:   Patient ID: KENNET MCCORT MRN: [id number]; DOB: [birth year]  Admit date: 01/01/2020 Date of Consult: 01/02/2020  Primary Care Provider: System, Provider Not In [facility] Cardiologist: Jodelle Red, MD (new) [facility] Electrophysiologist:  None    Patient Profile:   COLUMBUS ICE is a 59 y.o. male with a hx of HTN, depression, anxiety, GERD, chronic appearing RBBB who is being seen today for the evaluation of elevated troponin at the request of Dr. Mahala Menghini.  History of Present Illness:   Mr. Boys denies any previous cardiac history. He is a poor historian. He resides in an assisted living facility for unclear reasons, but suspect related to prior psychiatric issues. There is a behavioral health assessment from 2017 indicating the patient sometimes cannot walk, refuses to eat, and will stare mindlessly as well as previously displaying paranoid delusions. He has required diapers in the past. Schizoaffective/bipolar disorder diagnoses previously entered in chart but no notes attached. I asked if he ever had a stroke and he said he had one 15 years ago. There are no records to corroborate this.  He is able to tell me that he fell, but does not provide any other details. He frequently requires re-direction to ask question again to prompt a response. Per H/P, he was getting up to go to the activity room but when he stood up he got very weak and fell down. He was found by staff on the ground and was unable to get up. He had reportedly had diarrhea for several days, poor appetite and productive cough x 2 days. Upon arrival to the ED he was hypotensive at 75/55 with workup showing AKI with Cr of 1.8, lactic acidosis of 3.1. He was initially diagnosed with dehydration but then developed a fever concerning for sepsis. Labs also reveal decline in Hgb from 11.7->9.9, thrombocytopenia with platelet count 83->77, BNP 277, hsTroponin 109->120, INR 1.4, TSH  wnl, albumin 3.1, Covid negative, CXR nonacute, CT head nonacute. Cardiology consulted for elevated troponin. EKG shows NSR with RBBB that was also seen on 07/2019 EKG. He denies any known history of CP or SOB.  Past Medical History:  Diagnosis Date   Anxiety    Depression    GERD (gastroesophageal reflux disease)    HTN (hypertension)    RBBB     History reviewed. No pertinent surgical history.   Home Medications:  Prior to Admission medications   Medication Sig Start Date End Date Taking? Authorizing Provider  acetaminophen (TYLENOL) 325 MG tablet Take 650 mg by mouth every 6 (six) hours as needed for mild pain, moderate pain or fever.   Yes [provider]  cholestyramine (QUESTRAN) 4 g packet Take 2 g by mouth See admin instructions. Mix 2 grams in 8 oz of juice or water and drink by mouth on Monday, Wednesday and Friday at bedtime   Yes [provider]  diazepam (VALIUM) 5 MG tablet Take 5 mg by mouth 2 (two) times daily.   Yes [provider]  divalproex (DEPAKOTE) 125 MG DR tablet Take 125-250 mg by mouth See admin instructions. Take one tablet (125 mg) by mouth twice daily - 8am and 12pm and take two tablets (250 mg ) daily at 5pm 04/17/16  Yes [provider]  dronabinol (MARINOL) 2.5 MG capsule Take 2.5 mg by mouth daily before supper.   Yes [provider]  Ensure (ENSURE) Take 1 Can by mouth daily at 2 PM. chocolate   Yes [provider]  escitalopram (LEXAPRO) 10 MG tablet Take 15 mg by mouth daily.   Yes [provider]  famotidine (PEPCID) 10 MG tablet Take 10 mg by mouth 2 (two) times daily as needed for heartburn or indigestion.   Yes [provider]  lisinopril (ZESTRIL) 2.5 MG tablet Take 2.5 mg by mouth daily.  05/16/16  Yes [provider]  loperamide (IMODIUM A-D) 2 MG tablet Take 2 mg by mouth 4 (four) times daily as needed for diarrhea or loose stools.   Yes [provider]    meclizine (ANTIVERT) 25 MG tablet Take 25 mg by mouth daily as needed for dizziness (vertigo).   Yes [provider]  nystatin (NYSTATIN) powder Apply 1 application topically See admin instructions. Apply topically to right abdomen/groin three times daily as needed for redness/rash   Yes [provider]  oxybutynin (DITROPAN-XL) 10 MG 24 hr tablet Take 10 mg by mouth daily. 05/16/16  Yes [provider]  QUEtiapine (SEROQUEL) 100 MG tablet Take 100 mg by mouth 3 (three) times daily.  05/16/16  Yes [provider]  vitamin B-12 (CYANOCOBALAMIN) 500 MCG tablet Take 500 mcg by mouth daily.   Yes [provider]    Inpatient Medications: Scheduled Meds:  cholestyramine  2 g Oral Once per day on Mon Wed Fri   divalproex  125 mg Oral BID WC   And   divalproex  250 mg Oral Q supper   docusate sodium  100 mg Oral BID   dronabinol  2.5 mg Oral QAC supper   escitalopram  15 mg Oral Daily   oxybutynin  10 mg Oral Daily   QUEtiapine  100 mg Oral TID   Continuous Infusions:  sodium chloride 100 mL/hr at 01/01/20 1630   PRN Meds: acetaminophen **OR** acetaminophen, famotidine, meclizine, ondansetron **OR** ondansetron (ZOFRAN) IV  Allergies:    Allergies  Allergen Reactions   Sulfa Antibiotics Nausea And Vomiting    Social History:   Social History   Socioeconomic History   Marital status: Single    Spouse name: Not on file   Number of children: Not on file   Years of education: Not on file   Highest education level: Not on file  Occupational History   Not on file  Tobacco Use   Smoking status: Never Smoker   Smokeless tobacco: Never Used  Substance and Sexual Activity   Alcohol use: No   Drug use: No   Sexual activity: Never  Other Topics Concern   Not on file  Social History Narrative   Not on file   Social Determinants of Health   Financial Resource Strain:    Difficulty of Paying Living Expenses:    Food Insecurity:    Worried About Programme researcher, broadcasting/film/video in the Last Year:    Barista in the Last Year:   Transportation Needs:    Freight forwarder (Medical):    Lack of Transportation (Non-Medical):   Physical Activity:    Days of Exercise per Week:    Minutes of Exercise per Session:   Stress:    Feeling of Stress :   Social Connections:    Frequency of Communication with Friends and Family:    Frequency of Social Gatherings with Friends and Family:    Attends Religious Services:    Active Member of Clubs or Organizations:    Attends Banker Meetings:    Marital Status:   Intimate Partner Violence:  Fear of Current or Ex-Partner:    Emotionally Abused:    Physically Abused:    Sexually Abused:     Family History:    Family History  Problem Relation Age of Onset   Heart Problems Maternal Aunt        Patient does not know further details     ROS:  Please see the history of present illness.  All other ROS reviewed and negative, but limited given patient's mental status  Physical Exam/Data:   Vitals:   01/02/20 0456 01/02/20 0500 01/02/20 0641 01/02/20 0810  BP: (!) 94/51  (!) 95/54 (!) 100/58  Pulse: 76  72 70  Resp: 18  18 12   Temp: 99.2 F (37.3 C)  97.6 F (36.4 C) 98.7 F (37.1 C)  TempSrc: Oral  Oral Oral  SpO2: 93%  93% 97%  Weight:  74 kg    Height:        Intake/Output Summary (Last 24 hours) at 01/02/2020 0956 Last data filed at 01/02/2020 0839 Gross per 24 hour  Intake 4710 ml  Output 300 ml  Net 4410 ml   Last 3 Weights 01/02/2020 01/01/2020 06/09/2016  Weight (lbs) 163 lb 2.3 oz 163 lb 12.8 oz 189 lb 2 oz  Weight (kg) 74 kg 74.3 kg 85.787 kg     Body mass index is 21.52 kg/m.  General: Frail appearing middle aged man in no acute distress Head: Normocephalic, atraumatic, sclera non-icteric, no xanthomas, nares are without discharge. Neck: Negative for carotid bruits. JVP not elevated. Lungs: Clear  bilaterally to auscultation without wheezes, rales, or rhonchi. Breathing is unlabored. Productive cough noted Heart: RRR S1 S2 without murmurs, rubs, or gallops.  Abdomen: Soft, non-tender, non-distended with normoactive bowel sounds. No rebound/guarding. Extremities: No clubbing or cyanosis. No edema. Distal pedal pulses are 2+ and equal bilaterally. Neuro: Alert and oriented to self only, "[facility]," does not know date. Easily distracted, poor eye contact, requires direction to re-ask question to elicit response, follows commands, slight tremor of left hand noted Psych:  Flat affect  EKG:  The EKG was personally reviewed and demonstrates: Both admit and yesterday's echo reviewed showing NSR with RBBB and nonspecific STT changes consistent with January tracing  Telemetry:  Telemetry was personally reviewed and demonstrates:  NSR with brief ventricular bigeminy  Relevant CV Studies: Pending echo  Laboratory Data:  High Sensitivity Troponin:   Recent Labs  Lab 01/01/20 1108 01/01/20 1330  TROPONINIHS 109* 120*     Chemistry Recent Labs  Lab 01/01/20 1108 01/02/20 0445  NA 143 142  K 4.0 3.4*  CL 109 111  CO2 26 25  GLUCOSE 101* 102*  BUN 22* 22*  CREATININE 1.82* 0.89  CALCIUM 7.9* 8.2*  GFRNONAA 40* >60  GFRAA 46* >60  ANIONGAP 8 6    Recent Labs  Lab 01/01/20 1108 01/02/20 0445  PROT 5.6* 5.3*  ALBUMIN 3.3* 3.1*  AST 20 15  ALT 10 10  ALKPHOS 34* 28*  BILITOT 0.9 1.0   Hematology Recent Labs  Lab 01/01/20 1108 01/02/20 0445  WBC 6.8 6.6  RBC 3.65* 3.11*  HGB 11.7* 9.9*  HCT 35.4* 29.3*  MCV 97.0 94.2  MCH 32.1 31.8  MCHC 33.1 33.8  RDW 13.7 13.7  PLT 83* 77*   BNP Recent Labs  Lab 01/01/20 1108  BNP 277.1*    DDimer No results for input(s): DDIMER in the last 168 hours.   Radiology/Studies:  CT Head Wo Contrast  Result Date:  01/01/2020 CLINICAL DATA:  Fall.  Head injury.  Headache. EXAM: CT HEAD WITHOUT CONTRAST CT CERVICAL SPINE  WITHOUT CONTRAST TECHNIQUE: Multidetector CT imaging of the head and cervical spine was performed following the standard protocol without intravenous contrast. Multiplanar CT image reconstructions of the cervical spine were also generated. COMPARISON:  CT head 07/07/2019 FINDINGS: CT HEAD FINDINGS Brain: No evidence of acute infarction, hemorrhage, hydrocephalus, extra-axial collection or mass lesion/mass effect. Vascular: Negative for hyperdense vessel Skull: Negative Sinuses/Orbits: Mild mucosal edema paranasal sinuses. Negative orbit. Other: None CT CERVICAL SPINE FINDINGS Alignment: Mild retrolisthesis C3-4 and C4-5 Skull base and vertebrae: Negative for fracture Soft tissues and spinal canal: No soft tissue swelling or mass. Disc levels: Mild disc and facet degeneration at C4-5. Disc degeneration and uncinate spurring C5-6 and C6-7. No significant stenosis. Upper chest: Lung apices clear bilaterally. Other: None IMPRESSION: 1. No acute intracranial abnormality 2. Negative for cervical spine fracture. Electronically Signed   By: Marlan Palau M.D.   On: 01/01/2020 12:56   CT Cervical Spine Wo Contrast  Result Date: 01/01/2020 CLINICAL DATA:  Fall.  Head injury.  Headache. EXAM: CT HEAD WITHOUT CONTRAST CT CERVICAL SPINE WITHOUT CONTRAST TECHNIQUE: Multidetector CT imaging of the head and cervical spine was performed following the standard protocol without intravenous contrast. Multiplanar CT image reconstructions of the cervical spine were also generated. COMPARISON:  CT head 07/07/2019 FINDINGS: CT HEAD FINDINGS Brain: No evidence of acute infarction, hemorrhage, hydrocephalus, extra-axial collection or mass lesion/mass effect. Vascular: Negative for hyperdense vessel Skull: Negative Sinuses/Orbits: Mild mucosal edema paranasal sinuses. Negative orbit. Other: None CT CERVICAL SPINE FINDINGS Alignment: Mild retrolisthesis C3-4 and C4-5 Skull base and vertebrae: Negative for fracture Soft tissues and spinal  canal: No soft tissue swelling or mass. Disc levels: Mild disc and facet degeneration at C4-5. Disc degeneration and uncinate spurring C5-6 and C6-7. No significant stenosis. Upper chest: Lung apices clear bilaterally. Other: None IMPRESSION: 1. No acute intracranial abnormality 2. Negative for cervical spine fracture. Electronically Signed   By: Marlan Palau M.D.   On: 01/01/2020 12:56   DG Chest Port 1 View  Result Date: 01/01/2020 CLINICAL DATA:  Increasing weakness. EXAM: PORTABLE CHEST 1 VIEW COMPARISON:  Single-view of the chest 07/07/2019. FINDINGS: Lungs clear. Heart size normal. No pneumothorax or pleural fluid. No acute or focal bony abnormality. IMPRESSION: Negative chest. Electronically Signed   By: Drusilla Kanner M.D.   On: 01/01/2020 12:29   { Assessment and Plan:   1. Sepsis physiology with fever, hypotension, unclear source - urinalysis and CXR unrevealing, no blood cultures/urine culture ordered yet, will defer to primary team. Antihypertensives on hold.  2. AKI, improved with fluid resuscitation.  3. Elevated troponin - relatively low and flat, in the setting of hypotension in the 70s as well as possible sepsis. No anginal symptoms. EKG shows chronic RBBB without acute changes. He is not tachycardic, tachypneic, hypoxic or reporting any SOB/CP. - 2D echo is planned, which is reasonable for baseline. However, do not anticipate acute ischemic assessment this admission.  - Would not heparinize patient or start aspirin given drop in hemoglobin and thrombocytopenia - Lipid panel in AM for baseline - Will review further with MD.  4. Unusual mental status - notes from 2017 indicate similar observations to that witnessed today. History of depression noted, with possible prior diagnoses of schizoaffective/bipolar disorder. Suspect he is in ALF for these reasons.  5. Hypokalemia, anemia, thrombocytopenia - Management per primary team  For questions or updates, please  contact [facility] Please consult [email or web address] for contact info under    Signed, Laurann MontanaDayna N Tonishia Steffy, PA-C  01/02/2020 9:56 AM

## 2020-01-02 NOTE — Progress Notes (Signed)
  Echocardiogram 2D Echocardiogram has been performed.  Jeremy Wu 01/02/2020, 9:24 AM

## 2020-01-03 DIAGNOSIS — I517 Cardiomegaly: Secondary | ICD-10-CM

## 2020-01-03 LAB — PROTIME-INR
INR: 1.4 — ABNORMAL HIGH (ref 0.8–1.2)
INR: 1.3 — ABNORMAL HIGH (ref 0.8–1.2)
Prothrombin Time: 17 seconds — ABNORMAL HIGH (ref 11.4–15.2)
Prothrombin Time: 15.3 seconds — ABNORMAL HIGH (ref 11.4–15.2)

## 2020-01-03 LAB — CBC WITH DIFFERENTIAL/PLATELET
Abs Immature Granulocytes: 0.01 10*3/uL (ref 0.00–0.07)
Basophils Absolute: 0 10*3/uL (ref 0.0–0.1)
Basophils Relative: 0 %
Eosinophils Absolute: 0 10*3/uL (ref 0.0–0.5)
Eosinophils Relative: 1 %
HCT: 28.6 % — ABNORMAL LOW (ref 39.0–52.0)
Hemoglobin: 9.4 g/dL — ABNORMAL LOW (ref 13.0–17.0)
Immature Granulocytes: 0 %
Lymphocytes Relative: 22 %
Lymphs Abs: 1.1 10*3/uL (ref 0.7–4.0)
MCH: 31.4 pg (ref 26.0–34.0)
MCHC: 32.9 g/dL (ref 30.0–36.0)
MCV: 95.7 fL (ref 80.0–100.0)
Monocytes Absolute: 0.9 10*3/uL (ref 0.1–1.0)
Monocytes Relative: 18 %
Neutro Abs: 3 10*3/uL (ref 1.7–7.7)
Neutrophils Relative %: 59 %
Platelets: 66 10*3/uL — ABNORMAL LOW (ref 150–400)
RBC: 2.99 MIL/uL — ABNORMAL LOW (ref 4.22–5.81)
RDW: 13.4 % (ref 11.5–15.5)
WBC: 5 10*3/uL (ref 4.0–10.5)
nRBC: 0 % (ref 0.0–0.2)

## 2020-01-03 LAB — COMPREHENSIVE METABOLIC PANEL
ALT: 11 U/L (ref 0–44)
AST: 17 U/L (ref 15–41)
Albumin: 2.7 g/dL — ABNORMAL LOW (ref 3.5–5.0)
Alkaline Phosphatase: 26 U/L — ABNORMAL LOW (ref 38–126)
Anion gap: 8 (ref 5–15)
BUN: 16 mg/dL (ref 6–20)
CO2: 25 mmol/L (ref 22–32)
Calcium: 7.6 mg/dL — ABNORMAL LOW (ref 8.9–10.3)
Chloride: 104 mmol/L (ref 98–111)
Creatinine, Ser: 0.78 mg/dL (ref 0.61–1.24)
GFR calc Af Amer: >60 mL/min (ref >60)
GFR calc non Af Amer: >60 mL/min (ref >60)
Glucose, Bld: 104 mg/dL — ABNORMAL HIGH (ref 70–99)
Potassium: 3.3 mmol/L — ABNORMAL LOW (ref 3.5–5.1)
Sodium: 137 mmol/L (ref 135–145)
Total Bilirubin: 0.8 mg/dL (ref 0.3–1.2)
Total Protein: 4.8 g/dL — ABNORMAL LOW (ref 6.5–8.1)

## 2020-01-03 LAB — LIPID PANEL
Cholesterol: 93 mg/dL (ref 0–200)
HDL: 30 mg/dL — ABNORMAL LOW (ref >40)
LDL Cholesterol: 54 mg/dL (ref 0–99)
Total CHOL/HDL Ratio: 3.1 RATIO
Triglycerides: 43 mg/dL (ref <150)
VLDL: 9 mg/dL (ref 0–40)

## 2020-01-03 LAB — BILIRUBIN, FRACTIONATED(TOT/DIR/INDIR)
Bilirubin, Direct: 0.2 mg/dL (ref 0.0–0.2)
Indirect Bilirubin: 0.7 mg/dL (ref 0.3–0.9)
Total Bilirubin: 0.9 mg/dL (ref 0.3–1.2)

## 2020-01-03 LAB — URINE CULTURE: Culture: NO GROWTH

## 2020-01-03 LAB — LACTATE DEHYDROGENASE: LDH: 165 U/L (ref 98–192)

## 2020-01-03 LAB — AMMONIA: Ammonia: 51 umol/L — ABNORMAL HIGH (ref 9–35)

## 2020-01-03 MED ORDER — LACTULOSE 10 GM/15ML PO SOLN
20.0000 g | Freq: Two times a day (BID) | ORAL | Status: DC
  Administered 2020-01-03 – 2020-01-06 (×7): 20 g via ORAL
  Filled 2020-01-03 (×7): qty 30

## 2020-01-03 MED ORDER — POTASSIUM CHLORIDE CRYS ER 20 MEQ PO TBCR
40.0000 meq | EXTENDED_RELEASE_TABLET | Freq: Every day | ORAL | Status: DC
  Administered 2020-01-03 – 2020-01-06 (×4): 40 meq via ORAL
  Filled 2020-01-03 (×4): qty 2

## 2020-01-03 MED ORDER — SODIUM CHLORIDE 0.9 % IV BOLUS
250.0000 mL | Freq: Once | INTRAVENOUS | Status: AC
  Administered 2020-01-03: 250 mL via INTRAVENOUS

## 2020-01-03 NOTE — Progress Notes (Signed)
PROGRESS NOTE    Roxanne MinsLouis H Vicario  WUJ:[id number] DOB: May 27, 1961 DOA: 01/01/2020 PCP: System, Provider Not In  Brief Narrative:  59 year old male resident of [facility] ?  Cognitive dysfunction secondary to severe emotional issues and instability His brother only relative gives history that he has lived with his family all of his life-previously was living in [place] had to be transferred to a psych hospital and [facility] but did not do well at the independent living and was transferred to assisted living At baseline he is alert walks to breakfast exercises and rides around the bus and is able to voice his needs It is noted he has a prior admission 11/2011 for acute pancreatitis Generalized anxiety disorder  for which she is on several medications [facility] with generalized weakness-found down on the ground by staff Staff reports wet cough diarrhea for several days In ED found to have hypotension heart rate 85 BP 75/50 white count 6.8 BUN/creatinine 22/1.8 normal (normal baseline creatinine less than 1) BNP 277 Troponin I 09-->120 cardiology Lactic acid 3.1 cycling down to 2.8 Some thrombocytopenia   Assessment & Plan:   Principal Problem:   Intermittent lightheadedness Active Problems:   Generalized anxiety disorder   MDD (major depressive disorder), recurrent severe, without psychosis (HCC)   1. Sepsis 2/2 aspiration pneumonia confirmed on CT scan a. Map low today therefore given 250 cc bolus b. Continue saline as below c. PNA as etiology-blood urine cultures pending from 7/2 d. Discontinue vancomycin (can also cause thrombocytopenia) continue cefepime e. De-escalate in several days-evaluation given this is possibly secondary to aspiration  f. we will ask nonemergent speech eval 2. Thrombocytopenia- DDX CVC liver?--Does not have a rash do not think this is ITP or TTP a. No clear etiology at this time however T-max  102.2 night of admission 7/1 b. Will r/o cirrhosis non emergently with US--feel meds related in addition c. Obtain platelet smear, reticulocyte count LDH haptoglobin and indirect and direct bilirubin to rule out hemolysis d. Sepsis is most likely etiology 3. Elevated troponin chronic right bundle branch block on EKG 07/2019 a. EKG unchanged from admission on repeat 7/2 AM b. Echocardiogram shows elevated right sided pressures-CVC liver? c. CT neg for PE but is + for PNA 4. AKI on admission a. 2/2 ACE + diarrhea b. Continue saline 100 cc/h 5. ?  Heart failure-chronic venous congestion liver causing elevated right-sided pressures-Hab[place] possibly for cirrhosis a. Per cardiologist 6. Anemia-likely dilutional in the setting of macrocytosis a. obtain Hemoccult b. no bleeding reported c. Ammonia up-start lactulose short term and see if mentation effect changes 7. Severe anxiety/behavioral issues necessitating institutionalization at a young age a. Depakote can cause hyperammonemia--STOPPING DEPAKOTE b. W slightly tremulous yesterday 7/2?  Parkinson's?  Elevated ammonia c. Did discontinue meclizine, Marinol-less tremulous today d. For now can continue Lexapro, Seroquel 100 3 times daily and will resume at lower doses of Valium at 2.5 instead of five twice daily  DVT prophylaxis: SCD Code Status: Full Family Communication: Long discussion with brother at the bedside again on 7/3 he understands need for more stability and answers with regards to some of the lab abnormalities Disposition: Inpatient  Status is: Inpatient  Remains inpatient appropriate because:Hemodynamically unstable and IV treatments appropriate due to intensity of illness or inability to take PO   Dispo: The patient is from: ALF              Anticipated d/c is to:  ALF              Anticipated d/c date is: 2 days              Patient currently is not medically stable to d/c.  Consultants:   Cardiology  Procedures:    IMPRESSIONS    1. Right ventricular systolic function is mildly reduced. The right  ventricular size is severely enlarged. There is moderately elevated  pulmonary artery systolic pressure. The estimated right ventricular  systolic pressure is 47.7 mmHg.  2. Left ventricular ejection fraction, by estimation, is 60 to 65%. The  left ventricle has normal function. The left ventricle has no regional  wall motion abnormalities. Left ventricular diastolic parameters were  normal. There is the interventricular  septum is flattened in systole, consistent with right ventricular pressure  overload.  3. Right atrial size was severely dilated.  4. The mitral valve is normal in structure. Trivial mitral valve  regurgitation. No evidence of mitral stenosis.  5. The aortic valve was not well visualized. Aortic valve regurgitation  is not visualized. No aortic stenosis is present.  6. The inferior vena cava is dilated in size with <50% respiratory  variability, suggesting right atrial pressure of 15 mmHg.  7. Cannot exclude atrial level shunt with right to left shunt based on  image 70.  * Conclusion(s)/Recommendation(s): Severely dilated right atrium and right  ventricle. Consider CT angiography of pulmonary arteries to exclude PE,  and focused echo with agitated saline to fully evaluate atrial septum in  setting of right sided chamber  enlargement. Critical results communicated and acknowledged by Dr.  Christopher/Cardiology at 10:21 am 01/02/20.   Antimicrobials: Vancomycin cefepime started on 7/2   Subjective: More verbal but per brother is still not back to his baseline-brother tells me occasionally walks to breakfast etc. as I elicited to yesterday He is however able to tell me with a lot more detail how he feels He has no pain no fever no nausea no vomiting He has no chest pain He is eating well with no recurrence of fever  Objective: Vitals:   01/03/20 0523 01/03/20 0636  01/03/20 0758 01/03/20 1136  BP: (!) 98/45  (!) 113/56 (!) 93/46  Pulse: 76   79  Resp: 20   14  Temp: 98.3 F (36.8 C)   99.5 F (37.5 C)  TempSrc:    Oral  SpO2: 94%   98%  Weight:  78.8 kg    Height:        Intake/Output Summary (Last 24 hours) at 01/03/2020 1330 Last data filed at 01/03/2020 1142 Gross per 24 hour  Intake 3110 ml  Output 1300 ml  Net 1810 ml   Filed Weights   01/01/20 1830 01/02/20 0500 01/03/20 0636  Weight: 74.3 kg 74 kg 78.8 kg    Examination:  General exam: Awake coherent flat affect some facial incongruity but no dissymmetry to smile Throat is clear Respiratory system: Clinically clear no egophony.TVR no TVF Cardiovascular system: S1-S2 no murmur rub or gallop Gastrointestinal system: Abdomen soft nontender no rebound no guarding no hepatosplenomegaly elicited no Murphy sign Central nervous system: Neurologically much improved no tremor and no flap He has good prosody of movement and good praxis compared to yesterday Extremities: No swelling Skin: Soft supple Psychiatry: Flat  Data Reviewed: I have personally reviewed following labs and imaging studies Ammonia 51 Potassium 3.3 BUN/creatinine down to 16/0.7 White count 5 Hemoglobin 9.4 (baseline 10-14) INR remains at 1.4  platelets  are 66   Radiology Studies: CT ANGIO CHEST PE W OR WO CONTRAST  Result Date: 01/02/2020 CLINICAL DATA:  Evaluate for PE EXAM: CT ANGIOGRAPHY CHEST WITH CONTRAST TECHNIQUE: Multidetector CT imaging of the chest was performed using the standard protocol during bolus administration of intravenous contrast. Multiplanar CT image reconstructions and MIPs were obtained to evaluate the vascular anatomy. CONTRAST:  OMNIPAQUE IOHEXOL 350 MG/ML SOLN COMPARISON:  Same-day noncontrast CT chest FINDINGS: Cardiovascular: Examination for pulmonary embolism is somewhat limited by breath motion artifact in the lung bases. Within this limitation, no evidence of pulmonary embolism  through the segmental pulmonary arterial level in the lower lobes and no evidence of embolism in the upper lobes. No evidence of pulmonary embolism. Cardiomegaly. No pericardial effusion. 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 Enlargement of the pulmonary arteries. Mediastinum/Nodes: No enlarged mediastinal, hilar, or axillary lymph nodes. Thyroid gland, trachea, and esophagus demonstrate no significant findings. Lungs/Pleura: Examination is somewhat limited by breath motion artifact. Redemonstrated dependent bibasilar heterogeneous airspace opacity and consolidation, right greater than left. Bronchial wall thickening and frothy debris in the dependent airways of the right lung. Upper Abdomen: No acute abnormality. Musculoskeletal: No chest wall abnormality. No acute or significant osseous findings. Review of the MIP images confirms the above findings. IMPRESSION: 1. Examination for pulmonary embolism is somewhat limited by breath motion artifact in the lung bases. Within this limitation, no evidence of pulmonary embolism through the segmental pulmonary arterial level in the lower lobes and no evidence of embolism in the upper lobes. 2. Redemonstrated dependent bibasilar heterogeneous airspace opacity and consolidation, right greater than left, with bronchial wall thickening and frothy debris in the dependent airways of the right lung. Findings are consistent with aspiration. 3. Cardiomegaly. 4. Enlargement of the pulmonary arteries, as can be seen with pulmonary arterial hypertension. Electronically Signed   By: Lauralyn Primes M.D.   On: 01/02/2020 17:12   CT Chest High Resolution  Result Date: 01/02/2020 CLINICAL DATA:  Found down, unresponsive, pulmonary embolism EXAM: CT CHEST WITHOUT CONTRAST TECHNIQUE: Multidetector CT imaging of the chest was performed following the standard protocol without intravenous contrast. High resolution imaging of the lungs, as well as  inspiratory and expiratory imaging, was performed. COMPARISON:  None. FINDINGS: Cardiovascular: Enlargement of the pulmonary arteries, the main pulmonary artery measuring approximately 3.0 cm, the right pulmonary artery measuring 3.7 cm and the left pulmonary artery measuring 3.5 cm. Normal heart size. No pericardial effusion. Mediastinum/Nodes: No enlarged mediastinal, hilar, or axillary lymph nodes. Thyroid gland, trachea, and esophagus demonstrate no significant findings. Lungs/Pleura: Examination is somewhat limited by breath motion artifact. There is dependent bibasilar heterogeneous airspace opacity and consolidation, right greater than left. Bronchial wall thickening and frothy debris in the dependent airways of the right lung. No evidence of fibrotic interstitial lung disease. No significant air trapping on expiratory phase imaging. Upper Abdomen: No acute abnormality. Musculoskeletal: No chest wall mass or suspicious bone lesions identified. IMPRESSION: 1. There is dependent bibasilar heterogeneous airspace opacity and consolidation, right greater than left, with bronchial wall thickening and frothy debris in the dependent airways of the right lung. Findings are consistent with aspiration. 2. No evidence of fibrotic interstitial lung disease. 3. Enlargement of the pulmonary arteries, as can be seen in pulmonary hypertension. 4. Please note that this examination is nondiagnostic for pulmonary embolism. Consider CT pulmonary angiogram. Electronically Signed   By: Lauralyn Primes M.D.   On: 01/02/2020 15:33   ECHOCARDIOGRAM COMPLETE  Result  Date: 01/02/2020    ECHOCARDIOGRAM REPORT   Patient Name:   ALGER KERSTEIN Date of Exam: 01/02/2020 Medical Rec #:  [id number]          Height:       73.0 in Accession #:    [id number]         Weight:       163.1 lb Date of Birth:  25-Jul-1960          BSA:          1.973 m Patient Age:    59 years           BP:           100/58 mmHg Patient Gender: M                  HR:            70 bpm. Exam Location:  Inpatient Procedure: 2D Echo                                 MODIFIED REPORT:    This report was modified by Weston Brass MD on 01/02/2020 due to critical                              result communication.  Indications:     abnormal ECG 794.31  History:         Patient has no prior history of Echocardiogram examinations.                  Risk Factors:Hypertension. RBBB.  Sonographer:     Celene Skeen RDCS (AE) Referring Phys:  [id number] Cipriano Bunker Diagnosing Phys: Weston Brass MD  Sonographer Comments: limited mobility IMPRESSIONS  1. Right ventricular systolic function is mildly reduced. The right ventricular size is severely enlarged. There is moderately elevated pulmonary artery systolic pressure. The estimated right ventricular systolic pressure is 47.7 mmHg.  2. Left ventricular ejection fraction, by estimation, is 60 to 65%. The left ventricle has normal function. The left ventricle has no regional wall motion abnormalities. Left ventricular diastolic parameters were normal. There is the interventricular septum is flattened in systole, consistent with right ventricular pressure overload.  3. Right atrial size was severely dilated.  4. The mitral valve is normal in structure. Trivial mitral valve regurgitation. No evidence of mitral stenosis.  5. The aortic valve was not well visualized. Aortic valve regurgitation is not visualized. No aortic stenosis is present.  6. The inferior vena cava is dilated in size with <50% respiratory variability, suggesting right atrial pressure of 15 mmHg.  7. Cannot exclude atrial level shunt with right to left shunt based on image 70. Conclusion(s)/Recommendation(s): Severely dilated right atrium and right ventricle. Consider CT angiography of pulmonary arteries to exclude PE, and focused echo with agitated saline to fully evaluate atrial septum in setting of right sided chamber enlargement. Critical results communicated and acknowledged by  Dr. Christopher/Cardiology at 10:21 am 01/02/20. FINDINGS  Left Ventricle: Left ventricular ejection fraction, by estimation, is 60 to 65%. The left ventricle has normal function. The left ventricle has no regional wall motion abnormalities. The left ventricular internal cavity size was normal in size. There is  no left ventricular hypertrophy. The interventricular septum is flattened in systole, consistent with right ventricular pressure overload. Left ventricular diastolic parameters were normal. Right Ventricle: The right ventricular  size is severely enlarged. No increase in right ventricular wall thickness. Right ventricular systolic function is mildly reduced. There is moderately elevated pulmonary artery systolic pressure. The tricuspid regurgitant velocity is 2.86 m/s, and with an assumed right atrial pressure of 15 mmHg, the estimated right ventricular systolic pressure is 47.7 mmHg. Left Atrium: Left atrial size was normal in size. Right Atrium: Right atrial size was severely dilated. Pericardium: There is no evidence of pericardial effusion. Mitral Valve: The mitral valve is normal in structure. Normal mobility of the mitral valve leaflets. Trivial mitral valve regurgitation. No evidence of mitral valve stenosis. Tricuspid Valve: The tricuspid valve is normal in structure. Tricuspid valve regurgitation is trivial. No evidence of tricuspid stenosis. Aortic Valve: The aortic valve was not well visualized. Aortic valve regurgitation is not visualized. No aortic stenosis is present. Pulmonic Valve: The pulmonic valve was grossly normal. Pulmonic valve regurgitation is trivial. No evidence of pulmonic stenosis. Aorta: The aortic root is normal in size and structure. Venous: The inferior vena cava is dilated in size with less than 50% respiratory variability, suggesting right atrial pressure of 15 mmHg. IAS/Shunts: Cannot exclude atrial level shunt with right to left shunt based on image 70.  LEFT VENTRICLE PLAX  2D LVIDd:         2.90 cm  Diastology LVIDs:         1.90 cm  LV e' lateral:   8.59 cm/s LV PW:         1.00 cm  LV E/e' lateral: 8.5 LV IVS:        0.80 cm  LV e' medial:    8.70 cm/s LVOT diam:     2.10 cm  LV E/e' medial:  8.4 LV SV:         44 LV SV Index:   22 LVOT Area:     3.46 cm  RIGHT VENTRICLE RV S prime:     12.90 cm/s TAPSE (M-mode): 2.1 cm LEFT ATRIUM         Index      RIGHT ATRIUM           Index LA diam:    2.50 cm 1.27 cm/m RA Area:     22.60 cm                                RA Volume:   75.90 ml  38.47 ml/m  AORTIC VALVE LVOT Vmax:   62.70 cm/s LVOT Vmean:  48.700 cm/s LVOT VTI:    0.126 m  AORTA Ao Root diam: 3.00 cm MITRAL VALVE               TRICUSPID VALVE MV Area (PHT): 3.93 cm    TR Peak grad:   32.7 mmHg MV Decel Time: 193 msec    TR Vmax:        286.00 cm/s MV E velocity: 72.80 cm/s MV A velocity: 54.00 cm/s  SHUNTS MV E/A ratio:  1.35        Systemic VTI:  0.13 m                            Systemic Diam: 2.10 cm Weston Brass MD Electronically signed by Weston Brass MD Signature Date/Time: 01/02/2020/10:20:00 AM    Final (Updated)      Scheduled Meds: . cholestyramine  2 g Oral Once per day on Mon Wed  Fri  . diazepam  2 mg Oral Q12H  . escitalopram  15 mg Oral Daily  . lactulose  20 g Oral BID  . oxybutynin  10 mg Oral Daily  . potassium chloride  40 mEq Oral Daily  . QUEtiapine  100 mg Oral TID   Continuous Infusions: . sodium chloride 100 mL/hr at 01/03/20 1452  . ceFEPime (MAXIPIME) IV 2 g (01/03/20 1515)  . vancomycin 1,000 mg (01/03/20 0938)     LOS: 2 days    Time spent:  45 minutes  Rhetta Mura, MD [facility] To contact the attending provider between 7A-7P or the covering provider during after hours 7P-7A, please log into the web site [email or web address] and access using universal [place] password for that web site. If you do not have the password, please call the hospital operator.  01/03/2020, 1:30 PM

## 2020-01-03 NOTE — Progress Notes (Signed)
Progress Note   Subjective   Doing well today, the patient denies CP.  Denies SOB.  Poor historian.  Brother is at bedside and feels that he is doing reasonably well when asked. No new concerns  Inpatient Medications    Scheduled Meds: . cholestyramine  2 g Oral Once per day on Mon Wed Fri  . diazepam  2 mg Oral Q12H  . divalproex  125 mg Oral BID WC   And  . divalproex  250 mg Oral Q supper  . escitalopram  15 mg Oral Daily  . oxybutynin  10 mg Oral Daily  . potassium chloride  40 mEq Oral Daily  . QUEtiapine  100 mg Oral TID   Continuous Infusions: . sodium chloride 100 mL/hr at 01/03/20 0405  . ceFEPime (MAXIPIME) IV 2 g (01/03/20 0518)  . vancomycin 1,000 mg (01/03/20 0938)   PRN Meds: acetaminophen **OR** acetaminophen, famotidine, meclizine, ondansetron **OR** ondansetron (ZOFRAN) IV   Vital Signs    Vitals:   01/03/20 0523 01/03/20 0636 01/03/20 0758 01/03/20 1136  BP: (!) 98/45  (!) 113/56 (!) 93/46  Pulse: 76   79  Resp: 20   14  Temp: 98.3 F (36.8 C)   99.5 F (37.5 C)  TempSrc:    Oral  SpO2: 94%   98%  Weight:  78.8 kg    Height:        Intake/Output Summary (Last 24 hours) at 01/03/2020 1309 Last data filed at 01/03/2020 1142 Gross per 24 hour  Intake 3110 ml  Output 1300 ml  Net 1810 ml   Filed Weights   01/01/20 1830 01/02/20 0500 01/03/20 0636  Weight: 74.3 kg 74 kg 78.8 kg    Telemetry    sinus - Personally Reviewed  Physical Exam   GEN- The patient is frail and chronically ill appearing, alert but poor historian Head- normocephalic, atraumatic Eyes-  Sclera clear, conjunctiva pink Ears- hearing intact Oropharynx- clear Neck- supple, Lungs-  normal work of breathing Heart- Regular rate and rhythm  GI- soft  Extremities- no clubbing, cyanosis, or edema  MS diffuse atrophy Skin- no rash or lesion   Labs    Chemistry Recent Labs  Lab 01/01/20 1108 01/02/20 0445 01/03/20 0427  NA 143 142 137  K 4.0 3.4* 3.3*  CL 109  111 104  CO2 26 25 25   GLUCOSE 101* 102* 104*  BUN 22* 22* 16  CREATININE 1.82* 0.89 0.78  CALCIUM 7.9* 8.2* 7.6*  PROT 5.6* 5.3* 4.8*  ALBUMIN 3.3* 3.1* 2.7*  AST 20 15 17   ALT 10 10 11   ALKPHOS 34* 28* 26*  BILITOT 0.9 1.0 0.8  GFRNONAA 40* >60 >60  GFRAA 46* >60 >60  ANIONGAP 8 6 8      Hematology Recent Labs  Lab 01/01/20 1108 01/02/20 0445 01/03/20 0427  WBC 6.8 6.6 5.0  RBC 3.65* 3.11* 2.99*  HGB 11.7* 9.9* 9.4*  HCT 35.4* 29.3* 28.6*  MCV 97.0 94.2 95.7  MCH 32.1 31.8 31.4  MCHC 33.1 33.8 32.9  RDW 13.7 13.7 13.4  PLT 83* 77* 66*     Patient ID  Jeremy Wu is a 59 y.o. male with a hx of HTN, depression, anxiety, GERD, chronic appearing RBBB who is being seen today for the evaluation of elevated troponin at the request of Dr. 03/03/20.  Assessment & Plan    1.  Elevated troponin Not due to ACS but more likely due to demand ischemic in the setting of  AKI and sepsis.   Now improve, without ischemic symptoms or ekg changes. No further workup planned currently.  2. RV enlargement with moderate pulmonary hypertension Unclear etiology Chest CT and CTA are reviewed and unrevealing. Given low platelets and elevated ammonia, primary team could consider liver ultrasound to evaluate for cirrhosis as the patient could have hepatopulmonary shunting as the cause for RV enlargement. Cardiology to see as needed over the weekend.  Hillis Range MD, [facility] 01/03/2020 1:09 PM

## 2020-01-04 ENCOUNTER — Inpatient Hospital Stay (HOSPITAL_COMMUNITY): Payer: BC Managed Care – PPO

## 2020-01-04 LAB — COMPREHENSIVE METABOLIC PANEL
ALT: 12 U/L (ref 0–44)
AST: 21 U/L (ref 15–41)
Albumin: 2.8 g/dL — ABNORMAL LOW (ref 3.5–5.0)
Alkaline Phosphatase: 27 U/L — ABNORMAL LOW (ref 38–126)
Anion gap: 11 (ref 5–15)
BUN: 9 mg/dL (ref 6–20)
CO2: 27 mmol/L (ref 22–32)
Calcium: 8 mg/dL — ABNORMAL LOW (ref 8.9–10.3)
Chloride: 105 mmol/L (ref 98–111)
Creatinine, Ser: 0.69 mg/dL (ref 0.61–1.24)
GFR calc Af Amer: >60 mL/min (ref >60)
GFR calc non Af Amer: >60 mL/min (ref >60)
Glucose, Bld: 88 mg/dL (ref 70–99)
Potassium: 3.5 mmol/L (ref 3.5–5.1)
Sodium: 143 mmol/L (ref 135–145)
Total Bilirubin: 0.9 mg/dL (ref 0.3–1.2)
Total Protein: 5.2 g/dL — ABNORMAL LOW (ref 6.5–8.1)

## 2020-01-04 LAB — CBC WITH DIFFERENTIAL/PLATELET
Abs Immature Granulocytes: 0.02 10*3/uL (ref 0.00–0.07)
Basophils Absolute: 0 10*3/uL (ref 0.0–0.1)
Basophils Relative: 0 %
Eosinophils Absolute: 0.2 10*3/uL (ref 0.0–0.5)
Eosinophils Relative: 5 %
HCT: 31.3 % — ABNORMAL LOW (ref 39.0–52.0)
Hemoglobin: 10.5 g/dL — ABNORMAL LOW (ref 13.0–17.0)
Immature Granulocytes: 1 %
Lymphocytes Relative: 27 %
Lymphs Abs: 1 10*3/uL (ref 0.7–4.0)
MCH: 32 pg (ref 26.0–34.0)
MCHC: 33.5 g/dL (ref 30.0–36.0)
MCV: 95.4 fL (ref 80.0–100.0)
Monocytes Absolute: 0.5 10*3/uL (ref 0.1–1.0)
Monocytes Relative: 15 %
Neutro Abs: 1.9 10*3/uL (ref 1.7–7.7)
Neutrophils Relative %: 52 %
Platelets: 68 10*3/uL — ABNORMAL LOW (ref 150–400)
RBC: 3.28 MIL/uL — ABNORMAL LOW (ref 4.22–5.81)
RDW: 13.2 % (ref 11.5–15.5)
WBC: 3.6 10*3/uL — ABNORMAL LOW (ref 4.0–10.5)
nRBC: 0 % (ref 0.0–0.2)

## 2020-01-04 LAB — PROTIME-INR
INR: 1.3 — ABNORMAL HIGH (ref 0.8–1.2)
Prothrombin Time: 15.3 seconds — ABNORMAL HIGH (ref 11.4–15.2)

## 2020-01-04 LAB — SAVE SMEAR(SSMR), FOR PROVIDER SLIDE REVIEW

## 2020-01-04 NOTE — Progress Notes (Signed)
PROGRESS NOTE    Jeremy Wu  RRN:[id number] DOB: 03/13/1961 DOA: 01/01/2020 PCP: System, Provider Not In  Brief Narrative:  59 year old male resident of [facility] ?  Cognitive dysfunction secondary to severe emotional issues and instability His brother only relative gives history that he has lived with his family all of his life-previously was living in [place] had to be transferred to a psych hospital and previously was at [facility] but did not do well at the independent living and was transferred to assisted living At baseline he is alert walks to breakfast exercises and rides around the bus and is able to voice his needs It is noted he has a prior admission 11/2011 for acute pancreatitis Generalized anxiety disorder  for which she is on several medications Came to [facility] long ED with generalized weakness-found down on the ground by staff Staff reports wet cough diarrhea for several days In ED found to have hypotension heart rate 85 BP 75/50 white count 6.8 BUN/creatinine 22/1.8 normal (normal baseline creatinine less than 1) BNP 277 Troponin I 09-->120 cardiology Lactic acid 3.1 cycling down to 2.8 Some thrombocytopenia   Assessment & Plan:   Principal Problem:   Intermittent lightheadedness Active Problems:   Generalized anxiety disorder   MDD (major depressive disorder), recurrent severe, without psychosis (HCC)   1. Sepsis 2/2 aspiration pneumonia confirmed on CT scan a. Hypotensive 7/3 now resolved with fluids b. PNA as etiology NGTD blood/urine cultures 7/2 c. Discontinue vancomycin (can also cause thrombocytopenia) continue cefepime d. MBS  per SLP input 7/4 downgraded to dysphagia 3 diet 2. Thrombocytopenia- DDX CVC liver?--no rash ,uinlikely ITP or TTP a. No clear etiology at this time however T-max 102.2 night of admission 7/1 b. Ultrasound abdomen pelvis pending--feel meds related in addition c. Platelet smear shows schistocytes?   Hemolysis-await ldh and hapto ? sepsis and might not be mounting an immune response- d. Platelets have stabilized but would still hold heparin going forward 3. Elevated troponin chronic right bundle branch block on EKG 07/2019 a. EKG unchanged from admission on repeat 7/2 AM b. Echocardiogram shows elevated right sided pressures-CVC liver? c. CT neg for PE but is + for PNA 4. AKI on admission a. 2/2 ACE + diarrhea b. Cut back IV fluid rate to 50 cc/H 5. ?  Heart failure-chronic venous congestion liver causing elevated right-sided pressures-[place] possibly for cirrhosis a. Per cardiologist 6. Anemia-likely dilutional in the setting of macrocytosis a. obtain Hemoccult b. no bleeding reported 7. Severe anxiety/behavioral issues necessitating institutionalization at a young age a. Depakote can cause hyperammonemia--STOPPING DEPAKOTE b. Mentation is cleared with short-term lactulose-hyperammonemia could have been caused by Depakote  c. Did discontinue meclizine, Marinol-less tremulous today d. For now can continue Lexapro, Seroquel 100 3 times daily and will resume at lower doses of Valium at 2.5 instead of five twice daily  DVT prophylaxis: SCD Code Status: Full Family Communication: Discussed with brother on the telephone 7/4 Disposition: Inpatient  Status is: Inpatient  Remains inpatient appropriate because:Hemodynamically unstable and IV treatments appropriate due to intensity of illness or inability to take PO   Dispo: The patient is from: ALF              Anticipated d/c is to: ALF              Anticipated d/c date is: 3 days              Patient currently is not medically stable to d/c.  Consultants:   Cardiology  Procedures:  IMPRESSIONS   Conclusion(s)/Recommendation(s): Severely dilated right atrium and right  ventricle. Consider CT angiography of pulmonary arteries to exclude PE,  and focused echo with agitated saline to fully evaluate atrial septum in  setting of  right sided chamber  enlargement. Critical results communicated and acknowledged by Dr.  Christopher/Cardiology at 10:21 am 01/02/20.   Antimicrobials: Vancomycin cefepime started on 7/2   Subjective: Seems closer to his baseline Verbalizing  Objective: Vitals:   01/03/20 2006 01/04/20 0429 01/04/20 0928 01/04/20 1110  BP: 114/66 110/61 108/66 113/63  Pulse: 71 64 67 67  Resp: 18 18 18 20   Temp: 99 F (37.2 C) 98.2 F (36.8 C) 98.7 F (37.1 C) 98.5 F (36.9 C)  TempSrc: Oral Oral Oral Oral  SpO2: 96% 96% 99% 98%  Weight:  79.6 kg    Height:        Intake/Output Summary (Last 24 hours) at 01/04/2020 1312 Last data filed at 01/04/2020 1258 Gross per 24 hour  Intake 4598.08 ml  Output 2375 ml  Net 2223.08 ml   Filed Weights   01/02/20 0500 01/03/20 0636 01/04/20 0429  Weight: 74 kg 78.8 kg 79.6 kg    Examination:  General exam: Awake coherent  Respiratory system: Clinically clear no egophony.TVR no TVF Cardiovascular system: S1-S2 no murmur rub or gallop Gastrointestinal system: Abdomen soft nontender no rebound no guarding no hepatosplenomegaly elicited no Murphy sign Central nervous system: Neurologically He has good prosody of movement and good praxis compared to yesterday Extremities: No swelling Skin: Soft supple Psychiatry: Flat  Data Reviewed: I have personally reviewed following labs and imaging studies Potassium 3.5 BUN/creatinine down to 9/0.69 White count 5 Hemoglobin 9.4 -->10.5 platelets are 66 LDH 165 Bili dir 0.2, Indirect 0.7   Radiology Studies: CT ANGIO CHEST PE W OR WO CONTRAST  Result Date: 01/02/2020 CLINICAL DATA:  Evaluate for PE EXAM: CT ANGIOGRAPHY CHEST WITH CONTRAST TECHNIQUE: Multidetector CT imaging of the chest was performed using the standard protocol during bolus administration of intravenous contrast. Multiplanar CT image reconstructions and MIPs were obtained to evaluate the vascular anatomy. CONTRAST:  03/04/2020 OMNIPAQUE IOHEXOL  350 MG/ML SOLN COMPARISON:  Same-day noncontrast CT chest FINDINGS: Cardiovascular: Examination for pulmonary embolism is somewhat limited by breath motion artifact in the lung bases. Within this limitation, no evidence of pulmonary embolism through the segmental pulmonary arterial level in the lower lobes and no evidence of embolism in the upper lobes. No evidence of pulmonary embolism. Cardiomegaly. No pericardial effusion. 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 Enlargement of the pulmonary arteries. Mediastinum/Nodes: No enlarged mediastinal, hilar, or axillary lymph nodes. Thyroid gland, trachea, and esophagus demonstrate no significant findings. Lungs/Pleura: Examination is somewhat limited by breath motion artifact. Redemonstrated dependent bibasilar heterogeneous airspace opacity and consolidation, right greater than left. Bronchial wall thickening and frothy debris in the dependent airways of the right lung. Upper Abdomen: No acute abnormality. Musculoskeletal: No chest wall abnormality. No acute or significant osseous findings. Review of the MIP images confirms the above findings. IMPRESSION: 1. Examination for pulmonary embolism is somewhat limited by breath motion artifact in the lung bases. Within this limitation, no evidence of pulmonary embolism through the segmental pulmonary arterial level in the lower lobes and no evidence of embolism in the upper lobes. 2. Redemonstrated dependent bibasilar heterogeneous airspace opacity and consolidation, right greater than left, with bronchial wall thickening and frothy debris in the dependent airways of the right lung.  Findings are consistent with aspiration. 3. Cardiomegaly. 4. Enlargement of the pulmonary arteries, as can be seen with pulmonary arterial hypertension. Electronically Signed   By: Lauralyn Primes M.D.   On: 01/02/2020 17:12   CT Chest High Resolution  Result Date: 01/02/2020 CLINICAL DATA:  Found down,  unresponsive, pulmonary embolism EXAM: CT CHEST WITHOUT CONTRAST TECHNIQUE: Multidetector CT imaging of the chest was performed following the standard protocol without intravenous contrast. High resolution imaging of the lungs, as well as inspiratory and expiratory imaging, was performed. COMPARISON:  None. FINDINGS: Cardiovascular: Enlargement of the pulmonary arteries, the main pulmonary artery measuring approximately 3.0 cm, the right pulmonary artery measuring 3.7 cm and the left pulmonary artery measuring 3.5 cm. Normal heart size. No pericardial effusion. Mediastinum/Nodes: No enlarged mediastinal, hilar, or axillary lymph nodes. Thyroid gland, trachea, and esophagus demonstrate no significant findings. Lungs/Pleura: Examination is somewhat limited by breath motion artifact. There is dependent bibasilar heterogeneous airspace opacity and consolidation, right greater than left. Bronchial wall thickening and frothy debris in the dependent airways of the right lung. No evidence of fibrotic interstitial lung disease. No significant air trapping on expiratory phase imaging. Upper Abdomen: No acute abnormality. Musculoskeletal: No chest wall mass or suspicious bone lesions identified. IMPRESSION: 1. There is dependent bibasilar heterogeneous airspace opacity and consolidation, right greater than left, with bronchial wall thickening and frothy debris in the dependent airways of the right lung. Findings are consistent with aspiration. 2. No evidence of fibrotic interstitial lung disease. 3. Enlargement of the pulmonary arteries, as can be seen in pulmonary hypertension. 4. Please note that this examination is nondiagnostic for pulmonary embolism. Consider CT pulmonary angiogram. Electronically Signed   By: Lauralyn Primes M.D.   On: 01/02/2020 15:33   Scheduled Meds: . cholestyramine  2 g Oral Once per day on Mon Wed Fri  . diazepam  2 mg Oral Q12H  . escitalopram  15 mg Oral Daily  . lactulose  20 g Oral BID  .  oxybutynin  10 mg Oral Daily  . potassium chloride  40 mEq Oral Daily  . QUEtiapine  100 mg Oral TID   Continuous Infusions: . sodium chloride 100 mL/hr at 01/04/20 1258  . ceFEPime (MAXIPIME) IV 2 g (01/04/20 1334)     LOS: 3 days    Time spent:  45 minutes  Rhetta Mura, MD [facility] To contact the attending provider between 7A-7P or the covering provider during after hours 7P-7A, please log into the web site [email or web address] and access using universal [place] password for that web site. If you do not have the password, please call the hospital operator.  01/04/2020, 1:12 PM

## 2020-01-04 NOTE — Evaluation (Signed)
Clinical/Bedside Swallow Evaluation Patient Details  Name: Jeremy Wu MRN: [id number] Date of Birth: [birth year]  Today's Date: 01/04/2020 Time: SLP Start Time (ACUTE ONLY): 1224 SLP Stop Time (ACUTE ONLY): 1239 SLP Time Calculation (min) (ACUTE ONLY): 15 min  Past Medical History:  Past Medical History:  Diagnosis Date  . Anxiety   . Depression   . GERD (gastroesophageal reflux disease)   . HTN (hypertension)   . RBBB    Past Surgical History: History reviewed. No pertinent surgical history. HPI:  59 year old male resident of [facility].  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 His brother only relative gives history that he has lived with his family all of his life-previously was living in [place] had to be transferred to a psych hospital and previously was at [facility] but did not do well at the independent living and was transferred to assisted living. At baseline he is alert walks to breakfast exercises and rides around the bus and is able to voice his needs. Came to [facility] long ED with generalized weakness-found down on the ground by staff. Staff reports wet cough diarrhea for several days. CT chest shows :There is dependent bibasilar heterogeneous airspace opacity and consolidation, right greater than left, with bronchial wall thickening and frothy debris in the dependent airways of the right lung. Findings are consistent with aspiration.   Assessment / Plan / Recommendation Clinical Impression  Pt seen at lunch meal with brother at bedside who denies any difficulty swallowing but does report that an SLP has visited the pt at his facility and that staff has noticed wet coughing. Pt observed feeding himself, not particularly impulsive, and tolerating well until a coughing spell hit him after several bites of dry hamburger. With palpation while eating a cookie, pt noted to not always have a swallow response prior to  taking next bite. There is potential for impaired timing or significant vallecular packing prior to swallow given cognitive impairment, there is also potential for a primary esophageal dysphagia given that pt may be unable to describe any subtle complaints related to GERD or globus. Instrumental assessment needed to futher characterize risk and potential helpful interventions. Recommend MBS. Will change diet to mechanical soft texture with thin liquids in the meantime.  SLP Visit Diagnosis: Dysphagia, unspecified (R13.10)    Aspiration Risk  Moderate aspiration risk    Diet Recommendation Dysphagia 3 (Mech soft);Thin liquid   Liquid Administration via: Cup;Straw Medication Administration: Whole meds with liquid Supervision: Patient able to self feed Compensations: Slow rate;Small sips/bites Postural Changes: Seated upright at 90 degrees    Other  Recommendations Oral Care Recommendations: Oral care BID   Follow up Recommendations        Frequency and Duration            Prognosis        Swallow Study   General HPI: 59 year old male resident of [facility].  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 His brother only relative gives history that he has lived with his family all of his life-previously was living in [place] had to be transferred to a psych hospital and previously was at [facility] but did not do well at the independent living and was transferred to assisted living. At baseline he is alert walks to breakfast exercises and rides around the bus and is able to voice his needs. Came to [facility] long ED with generalized weakness-found down on the ground by staff.  Staff reports wet cough diarrhea for several daysCT chest shows :There is dependent bibasilar heterogeneous airspace opacity and consolidation, right greater than left, with bronchial wall thickening and frothy debris in the dependent airways of the right lung.  Findings are consistent with aspiration. Type of Study: Bedside Swallow Evaluation Diet Prior to this Study: Dysphagia 3 (soft);Thin liquids Temperature Spikes Noted: No Respiratory Status: Room air History of Recent Intubation: No Behavior/Cognition: Alert;Cooperative;Pleasant mood Oral Cavity Assessment: Within Functional Limits Oral Care Completed by SLP: No Oral Cavity - Dentition: Adequate natural dentition Vision: Functional for self-feeding Self-Feeding Abilities: Able to feed self Patient Positioning: Upright in chair Baseline Vocal Quality: Normal Volitional Cough: Strong Volitional Swallow: Able to elicit    Oral/Motor/Sensory Function Overall Oral Motor/Sensory Function: Within functional limits   Ice Chips     Thin Liquid Thin Liquid: Within functional limits Presentation: Straw;Self Fed    Nectar Thick Nectar Thick Liquid: Not tested   Honey Thick Honey Thick Liquid: Not tested   Puree Puree: Within functional limits Presentation: Self Fed   Solid     Solid: Impaired Presentation: Self Fed Pharyngeal Phase Impairments: Cough - Delayed;Suspected delayed Swallow      [name], Riley Nearing 01/04/2020,12:46 PM

## 2020-01-04 NOTE — Progress Notes (Signed)
Occupational Therapy Treatment Patient Details Name: [facility] Wu MRN: [id number] DOB: 1960/11/02 Today's Date: 01/04/2020    History of present illness Pt is a 59 y.o. male with medical history significant of hypertension, depression, anxiety, GERD lives at assisted living presents in the emergency department with generalized weakness and lightheadedness. Patient reports that he was getting up to go to the activity room.  When he stood up he got very weak and fell down.  Pt admitted with weakness, dehydration, hypotension, and AKI.  Troponins were mildly elevated - pt asymptomatic and evaluated by cardiology   OT comments  Jeremy Wu was able to perform transfers, standing, and ambulation to the sink with min assist and without assistive device. Patient exhibiting a mild posterior lean with standing. One mild posterior loss of balance.Patient performing grooming tasks - requiring verbal cues for each step with patient stating "now, what?". Patient needed assistance setting up food tray as well. Patient required encouragement to stay in the chair and not return to bed. Cont POC   Follow Up Recommendations  Home health OT    Equipment Recommendations  None recommended by OT    Recommendations for Other Services      Precautions / Restrictions Precautions Precautions: Fall       Mobility Bed Mobility   Bed Mobility: Supine to Sit     Supine to sit: [facility] elevated;Min assist     General bed mobility comments: Patient required hand hold assist to transfer to side of the bed.  Transfers Overall transfer level: Needs assistance Equipment used: None Transfers: Sit to/from Stand Sit to Stand: Min guard         General transfer comment: Min guard to stand from bed with verbal cue to push up with use of hands. Min assist when fully into standing due to impaired balance. Patient min assist (holding therapist's hand) to take steps to the sink. Min assist to steady  patient at sink due to mild posterior lean.  Patient min assist to take steps to recliner. Patient took steps backwards resulting in posterior loss of balance that therapist corrected.    Balance Overall balance assessment: Needs assistance Sitting-balance support: Bilateral upper extremity supported;Feet supported Sitting balance-Leahy Scale: Fair   Postural control: Posterior lean Standing balance support: Single extremity supported Standing balance-Leahy Scale: Poor                             ADL either performed or assessed with clinical judgement   ADL   Eating/Feeding: Set up Eating/Feeding Details (indicate cue type and reason): Therapist assisted patient with food set up due to patient's poor initiation. Grooming: Minimal assistance;Standing Grooming Details (indicate cue type and reason): Patient stood at sink to perform grooming task. Patient able to open tooth brush packaging, apply tooth paste and physically brush teeth. Therapist had to cue patient for the rinsing of the mouth with some assistance to fully swish and spit. Patient did not fully cleanse mouth and did not initiate completetion of task. Therapist had to provide verbal cue for each step to perform task fully.                                     Vision       Ambulance person  Exercises     Shoulder Instructions       General Comments      Pertinent Vitals/ Pain       Pain Assessment: No/denies pain  Home Living                                          Prior Functioning/Environment              Frequency  Min 2X/week        Progress Toward Goals  OT Goals(current goals can now be found in the care plan section)  Progress towards OT goals: Progressing toward goals  Acute Rehab OT Goals Patient Stated Goal: return to ALF OT Goal Formulation: With  patient/family Time For Goal Achievement: 01/16/20 Potential to Achieve Goals: Good  Plan Discharge plan remains appropriate    Co-evaluation          OT goals addressed during session: ADL's and self-care      AM-PAC OT "6 Clicks" Daily Activity     Outcome Measure   Help from another person eating meals?: A Little Help from another person taking care of personal grooming?: A Little Help from another person toileting, which includes using toliet, bedpan, or urinal?: A Little Help from another person bathing (including washing, rinsing, drying)?: A Little Help from another person to put on and taking off regular upper body clothing?: A Little Help from another person to put on and taking off regular lower body clothing?: A Little 6 Click Score: 18    End of Session Equipment Utilized During Treatment: Gait belt  OT Visit Diagnosis: History of falling (Z91.81);Dizziness and giddiness (R42)   Activity Tolerance Patient tolerated treatment well   Patient Left in chair;with call bell/phone within reach;with chair alarm set   Nurse Communication Mobility status        Time: 1140-1201 OT Time Calculation (min): 21 min  Charges: OT General Charges $OT Visit: 1 Visit OT Treatments $Self Care/Home Management : 8-22 mins  Waldron Session, OTR/L Acute Care Rehab Services  Office [phone] Pager: [phone]    [facility]rem[facility] 01/04/2020, 1:10 PM

## 2020-01-04 NOTE — Progress Notes (Signed)
PHARMACY NOTE -  Cefepime  Pharmacy has been assisting with dosing of cefepime for sepsis, r/o bacteremia.  Dosage remains stable at 2g IV q8 hr and need for further dosage adjustment appears unlikely at present given AKI resolved; SCr at baseline  Pharmacy will sign off, following peripherally for culture results or dose adjustments. Please reconsult if a change in clinical status warrants re-evaluation of dosage.  Bernadene Person, PharmD, BCPS [phone] 01/04/2020, 12:40 PM

## 2020-01-05 ENCOUNTER — Inpatient Hospital Stay (HOSPITAL_COMMUNITY): Payer: BC Managed Care – PPO

## 2020-01-05 DIAGNOSIS — I2721 Secondary pulmonary arterial hypertension: Secondary | ICD-10-CM

## 2020-01-05 DIAGNOSIS — Q211 Atrial septal defect: Secondary | ICD-10-CM

## 2020-01-05 LAB — CBC WITH DIFFERENTIAL/PLATELET
Abs Immature Granulocytes: 0.01 10*3/uL (ref 0.00–0.07)
Basophils Absolute: 0 10*3/uL (ref 0.0–0.1)
Basophils Relative: 0 %
Eosinophils Absolute: 0.2 10*3/uL (ref 0.0–0.5)
Eosinophils Relative: 5 %
HCT: 31.9 % — ABNORMAL LOW (ref 39.0–52.0)
Hemoglobin: 10.6 g/dL — ABNORMAL LOW (ref 13.0–17.0)
Immature Granulocytes: 0 %
Lymphocytes Relative: 35 %
Lymphs Abs: 1.2 10*3/uL (ref 0.7–4.0)
MCH: 31.4 pg (ref 26.0–34.0)
MCHC: 33.2 g/dL (ref 30.0–36.0)
MCV: 94.4 fL (ref 80.0–100.0)
Monocytes Absolute: 0.5 10*3/uL (ref 0.1–1.0)
Monocytes Relative: 15 %
Neutro Abs: 1.5 10*3/uL — ABNORMAL LOW (ref 1.7–7.7)
Neutrophils Relative %: 45 %
Platelets: 90 10*3/uL — ABNORMAL LOW (ref 150–400)
RBC: 3.38 MIL/uL — ABNORMAL LOW (ref 4.22–5.81)
RDW: 13.2 % (ref 11.5–15.5)
WBC: 3.4 10*3/uL — ABNORMAL LOW (ref 4.0–10.5)
nRBC: 0 % (ref 0.0–0.2)

## 2020-01-05 LAB — COMPREHENSIVE METABOLIC PANEL
ALT: 14 U/L (ref 0–44)
AST: 20 U/L (ref 15–41)
Albumin: 2.7 g/dL — ABNORMAL LOW (ref 3.5–5.0)
Alkaline Phosphatase: 34 U/L — ABNORMAL LOW (ref 38–126)
Anion gap: 7 (ref 5–15)
BUN: 10 mg/dL (ref 6–20)
CO2: 27 mmol/L (ref 22–32)
Calcium: 8.4 mg/dL — ABNORMAL LOW (ref 8.9–10.3)
Chloride: 109 mmol/L (ref 98–111)
Creatinine, Ser: 0.67 mg/dL (ref 0.61–1.24)
GFR calc Af Amer: >60 mL/min (ref >60)
GFR calc non Af Amer: >60 mL/min (ref >60)
Glucose, Bld: 89 mg/dL (ref 70–99)
Potassium: 3.3 mmol/L — ABNORMAL LOW (ref 3.5–5.1)
Sodium: 143 mmol/L (ref 135–145)
Total Bilirubin: 0.8 mg/dL (ref 0.3–1.2)
Total Protein: 5.2 g/dL — ABNORMAL LOW (ref 6.5–8.1)

## 2020-01-05 NOTE — Progress Notes (Signed)
PROGRESS NOTE    Jeremy Wu  XVQ:[id number] DOB: 1961-04-27 DOA: 01/01/2020 PCP: System, Provider Not In  Brief Narrative:  59 year old male resident of [facility] ? Cognitive dysfunction secondary to severe emotional issues and instability His brother only relative gives history that he has lived with his family all of his life-previously was living in [place] had to be transferred to a psych hospital and previously was at [facility] but did not do well at the independent living and was transferred to assisted living At baseline he is alert walks to breakfast exercises and rides around the bus and is able to voice his needs It is noted he has a prior admission 11/2011 for acute pancreatitis Generalized anxiety disorder  for which she is on several medications Came to [facility] long ED with generalized weakness-found down on the ground by staff Staff reports wet cough diarrhea for several days In ED found to have hypotension heart rate 85 BP 75/50 white count 6.8 BUN/creatinine 22/1.8 normal (normal baseline creatinine less than 1) BNP 277 Troponin I 09-->120 cardiology Lactic acid 3.1 cycling down to 2.8 Some thrombocytopenia   Assessment & Plan:   Principal Problem:   Intermittent lightheadedness Active Problems:   Generalized anxiety disorder   MDD (major depressive disorder), recurrent severe, without psychosis (HCC)   1. Sepsis 2/2 aspiration pneumonia confirmed on CT scan a. Hypotensive 7/3 now resolved with fluids b. PNA as etiology NGTD blood/urine cultures 7/2 c. Discontinue vancomycin (can also cause thrombocytopenia) continue cefepime d. MBS  per SLP input 7/4 downgraded to dysphagia 3 diet 2. ?  Heart failure-chronic venous congestion liver causing elevated right-sided pressures a. Per cardiologistThrombocytopenia- DDX CVC liver?--no rash ,uinlikely ITP or TTP b. Likely he has a combination of findings from right ventricular overload in  addition to Depakote use causing liver abnormalities c. No clear etiology- T-max 102.2 night of admission 7/1 d. Ultrasound abdomen pelvis 74 = cholelithiasis but increased echotexture e. Platelet smear shows schistocytes?  Hemolysis f. Platelets have stabilized in the 90 range 3. Elevated troponin chronic right bundle branch block on EKG 07/2019 a. Positive bubble study as per echocardiogram ordered by Dr. Jomarie Longs-- i. Needs OP TEE for furthe rplanning--[facility] b. CT neg for PE but is + for PNA c. Further work-up as per cardiology-would not order VQ scan at this time given alternate explanation as above 4. AKI on admission a. 2/2 ACE + diarrhea b. Force fluids, DC IV saline 7/5 5. Anemia-likely dilutional in the setting of macrocytosis a. No Hemoccult as no bleeding reported 6. Severe anxiety/behavioral issues necessitating institutionalization at a young age a. Depakote can cause hyperammonemia--STOPPING DEPAKOTE b. Mentation is cleared with short-term lactulose-hyperammonemia could have been caused by Depakote  c. Did discontinue meclizine, Marinol-less tremulous today d. For now can continue Lexapro, Seroquel 100 3 times daily and will resume at lower doses of Valium at 2.5 instead of five twice daily  DVT prophylaxis: SCD Code Status: Full Family Communication: d/w brother bedside Disposition: Inpatient  Status is: Inpatient  Remains inpatient appropriate because:Hemodynamically unstable and IV treatments appropriate due to intensity of illness or inability to take PO   Dispo: The patient is from: ALF              Anticipated d/c is to: ALF              Anticipated d/c date is: 3 days  Patient currently is not medically stable to d/c.  Consultants:   Cardiology  Procedures:  IMPRESSIONS   Conclusion(s)/Recommendation(s): Severely dilated right atrium and right  ventricle. Consider CT angiography of pulmonary arteries to exclude PE,  and focused echo with  agitated saline to fully evaluate atrial septum in  setting of right sided chamber  enlargement. Critical results communicated and acknowledged by Dr.  Christopher/Cardiology at 10:21 am 01/02/20.   Echo with saline agitated contrast reprot pending   Antimicrobials: Vancomycin discontinued on 7/4 Currently on cefepime started on 7/2   Subjective: Seems closer to his baseline Verbalizing well brother bedside states is close to normal  Objective: Vitals:   01/04/20 1110 01/04/20 2012 01/05/20 0410 01/05/20 0422  BP: 113/63 (!) 105/50 120/66   Pulse: 67 83 91   Resp: 20 18 18    Temp: 98.5 F (36.9 C) 99 F (37.2 C) 98.6 F (37 C)   TempSrc: Oral Oral Oral   SpO2: 98% 100% 93%   Weight:    78.9 kg  Height:        Intake/Output Summary (Last 24 hours) at 01/05/2020 1204 Last data filed at 01/05/2020 1157 Gross per 24 hour  Intake 1881.23 ml  Output 2075 ml  Net -193.77 ml   Filed Weights   01/03/20 0636 01/04/20 0429 01/05/20 0422  Weight: 78.8 kg 79.6 kg 78.9 kg    Examination:  General exam: Awake coherent  Respiratory system: Clinically clear no egophony.TVR no TVF Cardiovascular system: S1-S2 no murmur rub or gallop Gastrointestinal system: Abdomen soft nontender no rebound no guarding no hepatosplenomegaly elicited no Murphy sign Central nervous system: Neurologically He has good prosody of movement and good praxis compared to yesterday Extremities: No swelling Skin: Soft supple Psychiatry: Flat  Data Reviewed: I have personally reviewed following labs and imaging studies Potassium down from 3.5-3.3 BUN/creatinine 10/2.6 WBC 3.4 Platelets are up from 68-90   Radiology Studies: 03/07/20 Abdomen Complete  Result Date: 01/04/2020 CLINICAL DATA:  Liver cirrhosis. EXAM: ABDOMEN ULTRASOUND COMPLETE COMPARISON:  CT chest on 01/02/2020, ultrasound the abdomen 11/29/2011 FINDINGS: Gallbladder: Gallbladder wall is normal in thickness, 2.1 millimeters. No sonographic  Murphy's sign. Numerous calculi are identified, measuring on the order of 9 millimeters. Common bile duct: Diameter: 2.4 millimeters Liver: Coarsened echotexture. Normal liver morphology. No focal liver lesions. Portal vein is patent on color Doppler imaging with normal direction of blood flow towards the liver. IVC: No abnormality visualized. Pancreas: Obscured by bowel gas. Spleen: Size and appearance within normal limits. Right Kidney: Length: 11.1 centimeter. Echogenicity within normal limits. No mass or hydronephrosis visualized Left Kidney: Length: 11.0 centimeter. Echogenicity within normal limits. No mass or hydronephrosis visualized. Abdominal aorta: No aneurysm visualized. Other findings: Small amount of perihepatic fluid. Small bilateral pleural effusions. IMPRESSION: 1. Cholelithiasis. 2. No focal liver lesions. Coarsened echotexture of the liver without other features of hepatic steatosis or cirrhosis. 3. No hydronephrosis. Electronically Signed   By: 12/01/2011 M.D.   On: 01/04/2020 19:37   Scheduled Meds: . cholestyramine  2 g Oral Once per day on Mon Wed Fri  . diazepam  2 mg Oral Q12H  . escitalopram  15 mg Oral Daily  . lactulose  20 g Oral BID  . oxybutynin  10 mg Oral Daily  . potassium chloride  40 mEq Oral Daily  . QUEtiapine  100 mg Oral TID   Continuous Infusions: . sodium chloride 50 mL/hr at 01/05/20 0423  . ceFEPime (MAXIPIME) IV 2 g (01/05/20 0509)  LOS: 4 days    Time spent:  45 minutes  Rhetta Mura, MD [facility] To contact the attending provider between 7A-7P or the covering provider during after hours 7P-7A, please log into the web site [email or web address] and access using universal [place] password for that web site. If you do not have the password, please call the hospital operator.  01/05/2020, 12:04 PM

## 2020-01-05 NOTE — Progress Notes (Signed)
  Echocardiogram 2D Echocardiogram has been performed.  Jeremy Wu 01/05/2020, 9:49 AM

## 2020-01-05 NOTE — Progress Notes (Signed)
Progress Note  Patient Name: Jeremy Wu Date of Encounter: 01/05/2020  [facility] Cardiologist: Jodelle Red, MD   Subjective   No family at bedside. Not able to get much history from patient. He appears to be relaxed and comfortable. Still has a wet sounding cough.  Inpatient Medications    Scheduled Meds: . cholestyramine  2 g Oral Once per day on Mon Wed Fri  . diazepam  2 mg Oral Q12H  . escitalopram  15 mg Oral Daily  . lactulose  20 g Oral BID  . oxybutynin  10 mg Oral Daily  . potassium chloride  40 mEq Oral Daily  . QUEtiapine  100 mg Oral TID   Continuous Infusions: . sodium chloride 50 mL/hr at 01/05/20 0423  . ceFEPime (MAXIPIME) IV 2 g (01/05/20 0509)   PRN Meds: acetaminophen **OR** acetaminophen, famotidine, meclizine, ondansetron **OR** ondansetron (ZOFRAN) IV   Vital Signs    Vitals:   01/04/20 1110 01/04/20 2012 01/05/20 0410 01/05/20 0422  BP: 113/63 (!) 105/50 120/66   Pulse: 67 83 91   Resp: 20 18 18    Temp: 98.5 F (36.9 C) 99 F (37.2 C) 98.6 F (37 C)   TempSrc: Oral Oral Oral   SpO2: 98% 100% 93%   Weight:    78.9 kg  Height:        Intake/Output Summary (Last 24 hours) at 01/05/2020 0831 Last data filed at 01/05/2020 0423 Gross per 24 hour  Intake 2300.16 ml  Output 1700 ml  Net 600.16 ml   Last 3 Weights 01/05/2020 01/04/2020 01/03/2020  Weight (lbs) 173 lb 15.1 oz 175 lb 7.8 oz 173 lb 11.6 oz  Weight (kg) 78.9 kg 79.6 kg 78.8 kg      Telemetry    NSR - Personally Reviewed  ECG    NSR, RBBB, otw normal - Personally Reviewed  Physical Exam  Appears chronically ill GEN: No acute distress.   Neck: No JVD Cardiac: RRR, widely split S2, no murmurs, rubs, or gallops.  Respiratory: diminished right base. GI: Soft, nontender, non-distended  MS: No edema; No deformity. Neuro:  Nonfocal  Psych: Normal affect   Labs    High Sensitivity Troponin:   Recent Labs  Lab 01/01/20 1108 01/01/20 1330  TROPONINIHS  109* 120*      Chemistry Recent Labs  Lab 01/03/20 0427 01/03/20 0427 01/03/20 1657 01/04/20 0802 01/05/20 0531  NA 137  --   --  143 143  K 3.3*  --   --  3.5 3.3*  CL 104  --   --  105 109  CO2 25  --   --  27 27  GLUCOSE 104*  --   --  88 89  BUN 16  --   --  9 10  CREATININE 0.78  --   --  0.69 0.67  CALCIUM 7.6*  --   --  8.0* 8.4*  PROT 4.8*  --   --  5.2* 5.2*  ALBUMIN 2.7*  --   --  2.8* 2.7*  AST 17  --   --  21 20  ALT 11  --   --  12 14  ALKPHOS 26*  --   --  27* 34*  BILITOT 0.8   < > 0.9 0.9 0.8  GFRNONAA >60  --   --  >60 >60  GFRAA >60  --   --  >60 >60  ANIONGAP 8  --   --  11 7   < > =  values in this interval not displayed.     Hematology Recent Labs  Lab 01/03/20 0427 01/04/20 0802 01/05/20 0531  WBC 5.0 3.6* 3.4*  RBC 2.99* 3.28* 3.38*  HGB 9.4* 10.5* 10.6*  HCT 28.6* 31.3* 31.9*  MCV 95.7 95.4 94.4  MCH 31.4 32.0 31.4  MCHC 32.9 33.5 33.2  RDW 13.4 13.2 13.2  PLT 66* 68* 90*    BNP Recent Labs  Lab 01/01/20 1108  BNP 277.1*     DDimer No results for input(s): DDIMER in the last 168 hours.   Radiology    US Abdomen Complete  Result Date: 01/04/2020 CLINICAL DATA:  Liver cirrhosis. EXAM: ABDOMEN ULTRASOUND COMPLETE COMPARISON:  CT chest on 01/02/2020, ultrasound the abdomen 11/29/2011 FINDINGS: Gallbladder: Gallbladder wall is normal in thickness, 2.1 millimeters. No sonographic Murphy's sign. Numerous calculi are identified, measuring on the order of 9 millimeters. Common bile duct: Diameter: 2.4 millimeters Liver: Coarsened echotexture. Normal liver morphology. No focal liver lesions. Portal vein is patent on color Doppler imaging with normal direction of blood flow towards the liver. IVC: No abnormality visualized. Pancreas: Obscured by bowel gas. Spleen: Size and appearance within normal limits. Right Kidney: Length: 11.1 centimeter. Echogenicity within normal limits. No mass or hydronephrosis visualized Left Kidney: Length: 11.0  centimeter. Echogenicity within normal limits. No mass or hydronephrosis visualized. Abdominal aorta: No aneurysm visualized. Other findings: Small amount of perihepatic fluid. Small bilateral pleural effusions. IMPRESSION: 1. Cholelithiasis. 2. No focal liver lesions. Coarsened echotexture of the liver without other features of hepatic steatosis or cirrhosis. 3. No hydronephrosis. Electronically Signed   By: Norva Pavlov M.D.   On: 01/04/2020 19:37    Cardiac Studies   Reviewed echo 01/02/2020  Patient Profile     59 y.o. 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 Note absence of any arterial atherosclerotic changes on CT chest (cornaries, aorta and branches) - dilated bilateral pulmonary arteries and right atrium and preserved RV function despite marked dilation, chronic RBBB are all consistent with chronic right heart overload rather than a single acute event - on my review of the echo, the findings of RV volume overload are more important than the relatively mild increase in PA pressure, suggesting that shunting may be the dominant abnormality. - no clear evidence of shunt on echo Doppler, could have sinus venosus ASD and/or partial anomalous pulmonary vein return. On chest CT however, 3 of 4 major pulmonary veins can be clearly identified as draining to the right atrium. The RUPV probably drains to the left atrium also (lots of motion artifact, but contrast density distinct from SVC). Will order a limited echo with saline contrast. - chronic  thromboembolic pulmonary HTN is worth considering, but no clear evidence of CT with contrast (albeit a suboptimal study). VQ scan may be more sensitive. - he does not appear to have significant chronic lung disease or chronic hypoventilation. Consider OSA?  - thrombocytopenia and hyperammoniemia have raised concern for chronic liver disease, but have other potential explanations. There is hepatopetal portal vein flow. No evidence of portal HTN to explain the pulmonary HTN.  Will order a limited echo with saline contrast.  For questions or updates, please contact [facility] Please consult [email or web address] for contact info under        Signed, Thurmon Fair, MD  01/05/2020, 8:31 AM

## 2020-01-05 NOTE — Progress Notes (Signed)
Physical Therapy Treatment Patient Details Name: WANDA CELLUCCI MRN: [id number] DOB: 10/04/1960 Today's Date: 01/05/2020    History of Present Illness Pt is a 59 y.o. male with medical history significant of hypertension, depression, anxiety, GERD lives at assisted living presents in the emergency department with generalized weakness and lightheadedness. Patient reports that he was getting up to go to the activity room.  When he stood up he got very weak and fell down.  Pt admitted with weakness, dehydration, hypotension, and AKI.  Troponins were mildly elevated - pt asymptomatic and evaluated by cardiology    PT Comments    Pt agreeable to participate. He required Min assist for mobility on today. No family present during session. As long as ALF can provide current level of assist and increased supervision, pt could return to ALF.   Follow Up Recommendations  Home health PT (may need increased assistance at ALF)     Equipment Recommendations  None recommended by PT    Recommendations for Other Services       Precautions / Restrictions Precautions Precautions: Fall Restrictions Weight Bearing Restrictions: No    Mobility  Bed Mobility Overal bed mobility: Needs Assistance Bed Mobility: Supine to Sit;Sit to Supine     Supine to sit: Min guard;HOB elevated Sit to supine: Min assist;HOB elevated   General bed mobility comments: Assist for LEs. Increased time. Cues required.  Transfers Overall transfer level: Needs assistance Equipment used: None Transfers: Sit to/from Stand Sit to Stand: Min assist         General transfer comment: Min assist to steady. Cues required. Increased time.  Ambulation/Gait Ambulation/Gait assistance: Min Chemical engineer (Feet): 150 Feet Assistive device: None;IV Pole Gait Pattern/deviations: Step-through pattern;Staggering left;Scissoring;Narrow base of support;Decreased step length - right;Decreased step length - left      General Gait Details: Intermittent scissoring and LOB. Min assist provided throughout distance. Slow gait speed.   Stairs             Wheelchair Mobility    Modified Rankin (Stroke Patients Only)       Balance Overall balance assessment: Needs assistance Sitting-balance support: Bilateral upper extremity supported;Feet supported Sitting balance-Leahy Scale: Fair   Postural control: Posterior lean Standing balance support: Single extremity supported Standing balance-Leahy Scale: Fair                              Cognition Arousal/Alertness: Awake/alert Behavior During Therapy: Flat affect Overall Cognitive Status: History of cognitive impairments - at baseline                                 General Comments: Brother reports cognition is near baseline.  Pt was not able to state date and required increased time and repetition for commands.  Required multimodal cues at times.      Exercises      General Comments        Pertinent Vitals/Pain Pain Assessment: No/denies pain    Home Living                      Prior Function            PT Goals (current goals can now be found in the care plan section) Progress towards PT goals: Progressing toward goals    Frequency    Min 3X/week  PT Plan Current plan remains appropriate    Co-evaluation              AM-PAC PT "6 Clicks" Mobility   Outcome Measure  Help needed turning from your back to your side while in a flat bed without using bedrails?: A Little Help needed moving from lying on your back to sitting on the side of a flat bed without using bedrails?: A Little Help needed moving to and from a bed to a chair (including a wheelchair)?: A Little Help needed standing up from a chair using your arms (e.g., wheelchair or bedside chair)?: A Little Help needed to walk in hospital room?: A Little Help needed climbing 3-5 steps with a railing? : A Little 6  Click Score: 18    End of Session Equipment Utilized During Treatment: Gait belt Activity Tolerance: Patient tolerated treatment well Patient left: in bed;with call bell/phone within reach;with bed alarm set   PT Visit Diagnosis: Unsteadiness on feet (R26.81);Muscle weakness (generalized) (M62.81)     Time: 5427-0623 PT Time Calculation (min) (ACUTE ONLY): 17 min  Charges:  $Gait Training: 8-22 mins                         Faye Ramsay, PT Acute Rehabilitation  Office: [phone] Pager: [phone]

## 2020-01-05 NOTE — Consult Note (Signed)
Modified Barium Swallow Progress Note  Patient Details  Name: Jeremy Wu MRN: [id number] Date of Birth: May 22, 1961  Today's Date: 01/05/2020  Modified Barium Swallow completed.  Full report located under Chart Review in the Imaging Section.  Brief recommendations include the following:  Clinical Impression  Pt presents with mild oropharyngeal dysphagia characterized by oral tremoring/min oral holding with premature spillage to vallecular space and one incidence of spillage into pyriform sinuses resulting in penetration during swallow with thin via cup/straw, but material remained above the airway and was ejected out without compensatory strategies required; pt stated he "eats fast" during meal and paired with cognitive impairment/dyscoordination of swallow, places him at mild-moderate risk for aspiration.  Recommend ST f/u for education with pt/family (brother) and caregivers re: importance of rate of intake/swallowing precautions during PO intake; pt in agreement with swallow precautions, but due to cognitive impairment, may not be capable to follow through during Wu.  Dysphagia 3 (chopped)/thin liquids via cup/straw with smaller sips recommended for optimal safety with swallowing.   Swallow Evaluation Recommendations       SLP Diet Recommendations: Dysphagia 3 (Mech soft) solids;Thin liquid   Liquid Administration via: Cup;Straw;Other (Comment) (smaller sips)   Medication Administration: Whole meds with puree   Supervision: Patient able to self feed;Staff to assist with self feeding;Intermittent supervision to cue for compensatory strategies   Compensations: Slow rate;Small sips/bites   Postural Changes: Seated upright at 90 degrees   Oral Care Recommendations: Oral care BID        Tressie Stalker, M.S., CCC-SLP 01/05/2020,4:03 PM

## 2020-01-05 NOTE — Progress Notes (Signed)
Occupational Therapy Treatment Patient Details Name: Jeremy Wu MRN: [id number] DOB: February 02, 1961 Today's Date: 01/05/2020    History of present illness Pt is a 59 y.o. male with medical history significant of hypertension, depression, anxiety, GERD lives at assisted living presents in the emergency department with generalized weakness and lightheadedness. Patient reports that he was getting up to go to the activity room.  When he stood up he got very weak and fell down.  Pt admitted with weakness, dehydration, hypotension, and AKI.  Troponins were mildly elevated - pt asymptomatic and evaluated by cardiology   OT comments  Pt did sit EOB with OT this day.  Pt focused on lunch and then wanted to lie back down.  Follow Up Recommendations  Home health OT    Equipment Recommendations  None recommended by OT    Recommendations for Other Services      Precautions / Restrictions Precautions Precautions: Fall       Mobility Bed Mobility Overal bed mobility: Needs Assistance Bed Mobility: Supine to Sit;Sit to Supine     Supine to sit: Min assist Sit to supine: Min assist      Transfers                      Balance Overall balance assessment: Needs assistance Sitting-balance support: Bilateral upper extremity supported;Feet supported Sitting balance-Leahy Scale: Fair   Postural control: Posterior lean Standing balance support: Single extremity supported Standing balance-Leahy Scale: Poor                             ADL either performed or assessed with clinical judgement   ADL Overall ADL's : Needs assistance/impaired Eating/Feeding: Sitting;Minimal assistance   Grooming: Minimal assistance;Standing;Sitting Grooming Details (indicate cue type and reason): pt sat EOB with OT for self feeding task.  Pt overall min and mostly used LUE. Pt did use RUE upon command but with decreased initiation                               General ADL  Comments: pt declined getting to chair at this time.  Pt wanted to lie back down.     Vision Patient Visual Report: No change from baseline Vision Assessment?: No apparent visual deficits          Cognition Arousal/Alertness: Awake/alert Behavior During Therapy: Flat affect Overall Cognitive Status: History of cognitive impairments - at baseline                                                     Pertinent Vitals/ Pain       Pain Assessment: No/denies pain         Frequency  Min 2X/week        Progress Toward Goals  OT Goals(current goals can now be found in the care plan section)  Progress towards OT goals: Progressing toward goals     Plan Discharge plan remains appropriate    Co-evaluation                 AM-PAC OT "6 Clicks" Daily Activity     Outcome Measure   Help from another person eating meals?: A Little Help from another person taking care of  personal grooming?: A Little Help from another person toileting, which includes using toliet, bedpan, or urinal?: A Little Help from another person bathing (including washing, rinsing, drying)?: A Little Help from another person to put on and taking off regular upper body clothing?: A Little Help from another person to put on and taking off regular lower body clothing?: A Little 6 Click Score: 18    End of Session    OT Visit Diagnosis: History of falling (Z91.81);Dizziness and giddiness (R42)   Activity Tolerance Patient limited by lethargy   Patient Left with call bell/phone within reach;in bed;with bed alarm set   Nurse Communication Mobility status        Time: 1027-2536 OT Time Calculation (min): 16 min  Charges: OT General Charges $OT Visit: 1 Visit OT Treatments $Self Care/Home Management : 8-22 mins  Lise Auer, OT Acute Rehabilitation Services [phone] Office- [phone]      Cletis Clack, Karin Golden D 01/05/2020, 1:08 PM

## 2020-01-06 ENCOUNTER — Encounter (HOSPITAL_COMMUNITY): Payer: Self-pay | Admitting: Family Medicine

## 2020-01-06 ENCOUNTER — Telehealth: Payer: Self-pay | Admitting: Adult Health

## 2020-01-06 LAB — COMPREHENSIVE METABOLIC PANEL
ALT: 13 U/L (ref 0–44)
AST: 20 U/L (ref 15–41)
Albumin: 2.6 g/dL — ABNORMAL LOW (ref 3.5–5.0)
Alkaline Phosphatase: 35 U/L — ABNORMAL LOW (ref 38–126)
Anion gap: 9 (ref 5–15)
BUN: 10 mg/dL (ref 6–20)
CO2: 27 mmol/L (ref 22–32)
Calcium: 8.3 mg/dL — ABNORMAL LOW (ref 8.9–10.3)
Chloride: 105 mmol/L (ref 98–111)
Creatinine, Ser: 0.61 mg/dL (ref 0.61–1.24)
GFR calc Af Amer: >60 mL/min (ref >60)
GFR calc non Af Amer: >60 mL/min (ref >60)
Glucose, Bld: 87 mg/dL (ref 70–99)
Potassium: 3.4 mmol/L — ABNORMAL LOW (ref 3.5–5.1)
Sodium: 141 mmol/L (ref 135–145)
Total Bilirubin: 0.6 mg/dL (ref 0.3–1.2)
Total Protein: 5.1 g/dL — ABNORMAL LOW (ref 6.5–8.1)

## 2020-01-06 LAB — PROTIME-INR
INR: 1.3 — ABNORMAL HIGH (ref 0.8–1.2)
Prothrombin Time: 15.8 seconds — ABNORMAL HIGH (ref 11.4–15.2)

## 2020-01-06 LAB — HAPTOGLOBIN: Haptoglobin: 238 mg/dL (ref 29–370)

## 2020-01-06 MED ORDER — ESCITALOPRAM OXALATE 10 MG PO TABS: 15.0000 mg | ORAL_TABLET | Freq: Every day | ORAL | 0 refills | Status: AC

## 2020-01-06 MED ORDER — POTASSIUM CHLORIDE CRYS ER 20 MEQ PO TBCR: 40.0000 meq | EXTENDED_RELEASE_TABLET | Freq: Every day | ORAL | 0 refills | Status: AC

## 2020-01-06 MED ORDER — LEVOFLOXACIN 500 MG PO TABS
500.0000 mg | ORAL_TABLET | Freq: Every day | ORAL | 0 refills | Status: AC
Start: 2020-01-06 — End: 2020-01-08

## 2020-01-06 MED ORDER — LACTULOSE 10 GM/15ML PO SOLN: 20.0000 g | Freq: Two times a day (BID) | ORAL | 0 refills | Status: AC

## 2020-01-06 MED ORDER — DIAZEPAM 5 MG PO TABS: 5.0000 mg | ORAL_TABLET | Freq: Two times a day (BID) | ORAL | 0 refills | Status: AC

## 2020-01-06 NOTE — Progress Notes (Signed)
Pt to be discharged to [facility]. Report called to facility and Penni Bombard RN accepting report for this facility.

## 2020-01-06 NOTE — TOC Transition Note (Signed)
Transition of Care [facility]) - CM/SW Discharge Note   Patient Details  Name: Jeremy Wu MRN: [id number] Date of Birth: 1961-06-30  Transition of Care [facility]) CM/SW Contact:  Elliot Gault, LCSW Phone Number: 01/06/2020, 3:20 PM   Clinical Narrative:     Pt stable for dc back to ALF today per MD. Spoke with Kitten at the ALF and they can accept pt today. DC clinical sent by fax. Updated RN on number for report and arranged PTAR. RN called family to notify.   DC envelope at RN station. There are no other TOC needs for dc.  Final next level of care: Assisted Living Barriers to Discharge: Barriers Resolved   Patient Goals and CMS Choice        Discharge Placement                       Discharge Plan and Services In-house Referral: Clinical Social Work   Post Acute Care Choice: Resumption of Svcs/PTA Provider                               Social Determinants of Health (SDOH) Interventions     Readmission Risk Interventions Readmission Risk Prevention Plan 01/02/2020  Transportation Screening Complete  Medication Review (RN CM) Complete  Some recent data might be hidden

## 2020-01-06 NOTE — Telephone Encounter (Signed)
    Pt is scheduled for TOC with Jeremy Wu on 01/21/2020 per Judy Pimple

## 2020-01-06 NOTE — Progress Notes (Signed)
Physical Therapy Treatment Patient Details Name: Jeremy Wu MRN: [id number] DOB: Mar 21, 1961 Today's Date: 01/06/2020    History of Present Illness Pt is a 59 y.o. male with medical history significant of hypertension, depression, anxiety, GERD lives at assisted living presents in the emergency department with generalized weakness and lightheadedness. Patient reports that he was getting up to go to the activity room.  When he stood up he got very weak and fell down.  Pt admitted with weakness, dehydration, hypotension, and AKI.  Troponins were mildly elevated - pt asymptomatic and evaluated by cardiology    PT Comments    Pt continues to participate well. He remains unsteady when ambulating. Recommend increased assistance/supervision at ALF. Per chart, plan is for d/c back to ALF on today.    Follow Up Recommendations  Home health PT (may need increased assistance at ALF)     Equipment Recommendations  None recommended by PT    Recommendations for Other Services       Precautions / Restrictions Precautions Precautions: Fall Restrictions Weight Bearing Restrictions: No    Mobility  Bed Mobility Overal bed mobility: Needs Assistance Bed Mobility: Supine to Sit;Sit to Supine     Supine to sit: Supervision;HOB elevated Sit to supine: Supervision;HOB elevated      Transfers Overall transfer level: Needs assistance Equipment used: None Transfers: Sit to/from Stand Sit to Stand: Min guard         General transfer comment: Min guard for safety.  Ambulation/Gait Ambulation/Gait assistance: Min assist Gait Distance (Feet): 150 Feet Assistive device: None Gait Pattern/deviations: Step-through pattern;Staggering left;Scissoring;Narrow base of support;Decreased step length - right;Decreased step length - left     General Gait Details: Intermittent scissoring and unsteadiness. Min assist provided throughout distance. Slow gait speed.   Stairs              Wheelchair Mobility    Modified Rankin (Stroke Patients Only)       Balance Overall balance assessment: Needs assistance   Sitting balance-Leahy Scale: Fair       Standing balance-Leahy Scale: Fair                              Cognition Arousal/Alertness: Awake/alert Behavior During Therapy: Flat affect Overall Cognitive Status: History of cognitive impairments - at baseline                                 General Comments: Brother reports cognition is near baseline.  Pt was not able to state date and required increased time and repetition for commands.  Required multimodal cues at times.      Exercises      General Comments        Pertinent Vitals/Pain Pain Assessment: No/denies pain    Home Living                      Prior Function            PT Goals (current goals can now be found in the care plan section) Progress towards PT goals: Progressing toward goals    Frequency    Min 3X/week      PT Plan Current plan remains appropriate    Co-evaluation              AM-PAC PT "6 Clicks" Mobility   Outcome Measure  Help needed  turning from your back to your side while in a flat bed without using bedrails?: A Little Help needed moving from lying on your back to sitting on the side of a flat bed without using bedrails?: A Little Help needed moving to and from a bed to a chair (including a wheelchair)?: A Little Help needed standing up from a chair using your arms (e.g., wheelchair or bedside chair)?: A Little Help needed to walk in hospital room?: A Little Help needed climbing 3-5 steps with a railing? : A Little 6 Click Score: 18    End of Session Equipment Utilized During Treatment: Gait belt Activity Tolerance: Patient tolerated treatment well Patient left: in bed;with call bell/phone within reach;with bed alarm set   PT Visit Diagnosis: Unsteadiness on feet (R26.81);Muscle weakness (generalized)  (M62.81)     Time: 1120-1130 PT Time Calculation (min) (ACUTE ONLY): 10 min  Charges:  $Gait Training: 8-22 mins                         Faye Ramsay, PT Acute Rehabilitation  Office: [phone] Pager: [phone]

## 2020-01-06 NOTE — NC FL2 (Deleted)
[place] MEDICAID FL2 LEVEL OF CARE SCREENING TOOL     IDENTIFICATION  Patient Name: Jeremy Wu Birthdate: 1961/01/30 Sex: male Admission Date (Current Location): 01/01/2020  [facility] and [place] Number:  Producer, television/film/video and Address:  [facility],  [address] [place] [address]      Provider Number: [id number]  Attending Physician Name and Address:  Rhetta Mura, MD  Relative Name and Phone Number:       Current Level of Care: Hospital Recommended Level of Care: Assisted Living Facility Prior Approval Number:    Date Approved/Denied:   PASRR Number:    Discharge Plan: Other (Comment) (ALF)    Current Diagnoses: Patient Active Problem List   Diagnosis Date Noted  . Intermittent lightheadedness 01/01/2020  . 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 Acute pancreatitis 11/29/2011  . Generalized anxiety disorder 11/29/2011    Orientation RESPIRATION BLADDER Height & Weight     Self, Time, Situation, Place  Normal Incontinent Weight: 173 lb 15.1 oz (78.9 kg) Height:  6\' 1"  (185.4 cm)  BEHAVIORAL SYMPTOMS/MOOD NEUROLOGICAL BOWEL NUTRITION STATUS      Incontinent Diet (No concentrated sweets, no added salt)  AMBULATORY STATUS COMMUNICATION OF NEEDS Skin   Supervision Verbally Normal                       Personal Care Assistance Level of Assistance  Bathing, Feeding, Dressing Bathing Assistance: Limited assistance Feeding assistance: Independent Dressing Assistance: Limited assistance     Functional Limitations Info  Sight, Hearing, Speech Sight Info: Adequate Hearing Info: Adequate Speech Info: Adequate    SPECIAL CARE FACTORS FREQUENCY                       Contractures Contractures Info: Not present    Additional Factors Info  Code Status, Allergies Code Status Info: Full Allergies Info: Sulfa Antibiotics           Current Medications (01/06/2020):   This is the current hospital active medication list Current Facility-Administered Medications  Medication Dose Route Frequency Provider Last Rate Last Admin  . 0.9 %  sodium chloride infusion   Intravenous Continuous 03/08/2020, MD 50 mL/hr at 01/06/20 0200 Rate Verify at 01/06/20 0200  . acetaminophen (TYLENOL) tablet 650 mg  650 mg Oral Q6H PRN 03/08/20, MD   650 mg at 01/02/20 0224   Or  . acetaminophen (TYLENOL) suppository 650 mg  650 mg Rectal Q6H PRN 03/04/20, MD      . ceFEPIme (MAXIPIME) 2 g in sodium chloride 0.9 % 100 mL IVPB  2 g Intravenous Q8H Wofford, Drew A, RPH 200 mL/hr at 01/06/20 0523 2 g at 01/06/20 0523  . cholestyramine (QUESTRAN) packet 2 g  2 g Oral Once per day on Mon Wed Fri 12-07-1969, MD   2 g at 01/05/20 2237  . diazepam (VALIUM) tablet 2 mg  2 mg Oral Q12H 2238, MD   2 mg at 01/06/20 0924  . escitalopram (LEXAPRO) tablet 15 mg  15 mg Oral Daily 03/08/20, MD   15 mg at 01/06/20 0924  . famotidine (PEPCID) tablet 10 mg  10 mg Oral BID PRN 03/08/20, MD      . lactulose (CHRONULAC) 10 GM/15ML solution 20 g  20 g Oral BID Cipriano Bunker, MD   20 g at 01/06/20 0923  . meclizine (ANTIVERT) tablet 12.5 mg  12.5 mg Oral Daily PRN Rhetta Mura, MD      . ondansetron (ZOFRAN) tablet 4 mg  4 mg Oral Q6H PRN Cipriano Bunker, MD       Or  . ondansetron [facility]) injection 4 mg  4 mg Intravenous Q6H PRN Cipriano Bunker, MD      . oxybutynin (DITROPAN-XL) 24 hr tablet 10 mg  10 mg Oral Daily Cipriano Bunker, MD   10 mg at 01/06/20 0924  . potassium chloride SA (KLOR-CON) CR tablet 40 mEq  40 mEq Oral Daily Rhetta Mura, MD   40 mEq at 01/06/20 0924  . QUEtiapine (SEROQUEL) tablet 100 mg  100 mg Oral TID Cipriano Bunker, MD   100 mg at 01/06/20 9563     Discharge Medications:  STOP taking these medications   cholestyramine 4 g packet Commonly known as: QUESTRAN   divalproex 125 MG DR tablet Commonly  known as: DEPAKOTE   dronabinol 2.5 MG capsule Commonly known as: MARINOL   Ensure   lisinopril 2.5 MG tablet Commonly known as: ZESTRIL   loperamide 2 MG tablet Commonly known as: IMODIUM A-D   meclizine 25 MG tablet Commonly known as: ANTIVERT   nystatin powder Generic drug: nystatin     TAKE these medications   acetaminophen 325 MG tablet Commonly known as: TYLENOL Take 650 mg by mouth every 6 (six) hours as needed for mild pain, moderate pain or fever.   diazepam 5 MG tablet Commonly known as: VALIUM Take 1 tablet (5 mg total) by mouth 2 (two) times daily for 2 days.   escitalopram 10 MG tablet Commonly known as: LEXAPRO Take 1.5 tablets (15 mg total) by mouth daily.   famotidine 10 MG tablet Commonly known as: PEPCID Take 10 mg by mouth 2 (two) times daily as needed for heartburn or indigestion.   lactulose 10 GM/15ML solution Commonly known as: CHRONULAC Take 30 mLs (20 g total) by mouth 2 (two) times daily.   levofloxacin 500 MG tablet Commonly known as: Levaquin Take 1 tablet (500 mg total) by mouth daily for 2 days.   oxybutynin 10 MG 24 hr tablet Commonly known as: DITROPAN-XL Take 10 mg by mouth daily.   potassium chloride SA 20 MEQ tablet Commonly known as: KLOR-CON Take 2 tablets (40 mEq total) by mouth daily. Start taking on: January 07, 2020   QUEtiapine 100 MG tablet Commonly known as: SEROQUEL Take 100 mg by mouth 3 (three) times daily.   vitamin B-12 500 MCG tablet Commonly known as: CYANOCOBALAMIN Take 500 mcg by mouth daily.          Allergies  Allergen Reactions  . Sulfa Antibiotics Nausea And Vomiting       Relevant Imaging Results:  Relevant Lab Results:   Additional Information Please arrange for PT and OT to evaluate and treat at the ALF  Elliot Gault, LCSW

## 2020-01-06 NOTE — NC FL2 (Signed)
[place] MEDICAID FL2 LEVEL OF CARE SCREENING TOOL     IDENTIFICATION  Patient Name: Jeremy Wu Birthdate: 06-27-1961 Sex: male Admission Date (Current Location): 01/01/2020  [facility] and [place] Number:  Producer, television/film/video and Address:  [facility],  [address] [place] [address]      Provider Number: [id number]  Attending Physician Name and Address:  Rhetta Mura, MD  Relative Name and Phone Number:       Current Level of Care: Hospital Recommended Level of Care: Assisted Living Facility Prior Approval Number:    Date Approved/Denied:   PASRR Number:    Discharge Plan: Other (Comment) (ALF)    Current Diagnoses: Patient Active Problem List   Diagnosis Date Noted  . Intermittent lightheadedness 01/01/2020  . 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 Acute pancreatitis 11/29/2011  . Generalized anxiety disorder 11/29/2011    Orientation RESPIRATION BLADDER Height & Weight     Self, Time, Situation, Place  Normal Incontinent Weight: 173 lb 15.1 oz (78.9 kg) Height:  6\' 1"  (185.4 cm)  BEHAVIORAL SYMPTOMS/MOOD NEUROLOGICAL BOWEL NUTRITION STATUS      Incontinent Diet (regular, no added salt, no concentrated sweets)  AMBULATORY STATUS COMMUNICATION OF NEEDS Skin   Supervision Verbally Normal                       Personal Care Assistance Level of Assistance  Bathing, Feeding, Dressing Bathing Assistance: Limited assistance Feeding assistance: Independent Dressing Assistance: Limited assistance     Functional Limitations Info  Sight, Hearing, Speech Sight Info: Adequate Hearing Info: Adequate Speech Info: Adequate    SPECIAL CARE FACTORS FREQUENCY                       Contractures Contractures Info: Not present    Additional Factors Info  Code Status, Allergies Code Status Info: Full Allergies Info: Sulfa Antibiotics           Current Medications  (01/06/2020):  This is the current hospital active medication list Current Facility-Administered Medications  Medication Dose Route Frequency Provider Last Rate Last Admin  . 0.9 %  sodium chloride infusion   Intravenous Continuous 03/08/2020, MD 50 mL/hr at 01/06/20 0200 Rate Verify at 01/06/20 0200  . acetaminophen (TYLENOL) tablet 650 mg  650 mg Oral Q6H PRN 03/08/20, MD   650 mg at 01/02/20 0224   Or  . acetaminophen (TYLENOL) suppository 650 mg  650 mg Rectal Q6H PRN 03/04/20, MD      . ceFEPIme (MAXIPIME) 2 g in sodium chloride 0.9 % 100 mL IVPB  2 g Intravenous Q8H Wofford, Drew A, RPH 200 mL/hr at 01/06/20 0523 2 g at 01/06/20 0523  . cholestyramine (QUESTRAN) packet 2 g  2 g Oral Once per day on Mon Wed Fri 12-07-1969, MD   2 g at 01/05/20 2237  . diazepam (VALIUM) tablet 2 mg  2 mg Oral Q12H 2238, MD   2 mg at 01/06/20 0924  . escitalopram (LEXAPRO) tablet 15 mg  15 mg Oral Daily 03/08/20, MD   15 mg at 01/06/20 0924  . famotidine (PEPCID) tablet 10 mg  10 mg Oral BID PRN 03/08/20, MD      . lactulose (CHRONULAC) 10 GM/15ML solution 20 g  20 g Oral BID Cipriano Bunker, MD   20 g at 01/06/20 0923  . meclizine (ANTIVERT) tablet 12.5 mg  12.5 mg Oral Daily PRN Rhetta Mura, MD      . ondansetron (ZOFRAN) tablet 4 mg  4 mg Oral Q6H PRN Cipriano Bunker, MD       Or  . ondansetron [facility]) injection 4 mg  4 mg Intravenous Q6H PRN Cipriano Bunker, MD      . oxybutynin (DITROPAN-XL) 24 hr tablet 10 mg  10 mg Oral Daily Cipriano Bunker, MD   10 mg at 01/06/20 0924  . potassium chloride SA (KLOR-CON) CR tablet 40 mEq  40 mEq Oral Daily Rhetta Mura, MD   40 mEq at 01/06/20 0924  . QUEtiapine (SEROQUEL) tablet 100 mg  100 mg Oral TID Cipriano Bunker, MD   100 mg at 01/06/20 0947     Discharge Medications:  STOP taking these medications   cholestyramine 4 g packet Commonly known as: QUESTRAN   divalproex 125 MG DR  tablet Commonly known as: DEPAKOTE   dronabinol 2.5 MG capsule Commonly known as: MARINOL   Ensure   lisinopril 2.5 MG tablet Commonly known as: ZESTRIL   loperamide 2 MG tablet Commonly known as: IMODIUM A-D   meclizine 25 MG tablet Commonly known as: ANTIVERT   nystatin powder Generic drug: nystatin     TAKE these medications   acetaminophen 325 MG tablet Commonly known as: TYLENOL Take 650 mg by mouth every 6 (six) hours as needed for mild pain, moderate pain or fever.   diazepam 5 MG tablet Commonly known as: VALIUM Take 1 tablet (5 mg total) by mouth 2 (two) times daily for 2 days.   escitalopram 10 MG tablet Commonly known as: LEXAPRO Take 1.5 tablets (15 mg total) by mouth daily.   famotidine 10 MG tablet Commonly known as: PEPCID Take 10 mg by mouth 2 (two) times daily as needed for heartburn or indigestion.   lactulose 10 GM/15ML solution Commonly known as: CHRONULAC Take 30 mLs (20 g total) by mouth 2 (two) times daily.   levofloxacin 500 MG tablet Commonly known as: Levaquin Take 1 tablet (500 mg total) by mouth daily for 2 days.   oxybutynin 10 MG 24 hr tablet Commonly known as: DITROPAN-XL Take 10 mg by mouth daily.   potassium chloride SA 20 MEQ tablet Commonly known as: KLOR-CON Take 2 tablets (40 mEq total) by mouth daily. Start taking on: January 07, 2020   QUEtiapine 100 MG tablet Commonly known as: SEROQUEL Take 100 mg by mouth 3 (three) times daily.   vitamin B-12 500 MCG tablet Commonly known as: CYANOCOBALAMIN Take 500 mcg by mouth daily.          Allergies  Allergen Reactions  . Sulfa Antibiotics Nausea And Vomiting      Relevant Imaging Results:  Relevant Lab Results:   Additional Information Please arrange for PT and OT to evaluate and treat at the ALF  Elliot Gault, LCSW

## 2020-01-06 NOTE — Discharge Summary (Addendum)
Physician Discharge Summary  Jeremy Wu ZOX:[id number] DOB: 1961/06/24 DOA: 01/01/2020  PCP: System, Provider Not In  Admit date: 01/01/2020 Discharge date: 01/06/2020  Time spent: 40 minutes  Recommendations for Outpatient Follow-up:  1. Needs outpatient care coordination with cardiology for TEE to rule out VSD 2. Complete Levaquin course for aspiration pneumonia and keep strict speech precautions 3. Needs chest x-ray 1 month 4. Needs CBC Chem-12 1 week 5. Please follow-up with haptoglobin and repeat hemolysis labs if he continues to have thrombocytopenia I would consider referral to hematology although the causes are likely secondary to his Depakote and or chronic venous congestion of liver 6. Please do not use Depakote in the future for mood stabilization and please attempt to de-escalate with psychiatry's help some medications as may have a risk for encephalopathy and polypharmacy  Discharge Diagnoses:  Principal Problem:   Intermittent lightheadedness Active Problems:   Generalized anxiety disorder   MDD (major depressive disorder), recurrent severe, without psychosis (HCC)   Discharge Condition: Improved  Diet recommendation: Dysphagia 3 diet  Filed Weights   01/03/20 0636 01/04/20 0429 01/05/20 0422  Weight: 78.8 kg 79.6 kg 78.9 kg    History of present illness:  59 year old male resident of [facility] ? Cognitive dysfunction secondary to severe emotional issues and instability His brother only relative gives history that he has lived with his family all of his life-previously was living in [place] had to be transferred to a psych hospital and previously was at [facility] but did not do well at the independent living and was transferred to assisted living At baseline he is alert walks to breakfast exercises and rides around the bus and is able to voice his needs It is noted he has a prior admission 11/2011 for acute pancreatitis Generalized  anxiety disorder  for which she is on several medications Came to [facility] long ED with generalized weakness-found down on the ground by staff Staff reports wet cough diarrhea for several days In ED found to have hypotension heart rate 85 BP 75/50 white count 6.8 BUN/creatinine 22/1.8 normal (normal baseline creatinine less than 1) BNP 277 Troponin I 09-->120 cardiology Lactic acid 3.1 cycling down to 2.8 Some thrombocytopenia   Hospital Course:  1. Sepsis 2/2 aspiration pneumonia confirmed on CT scan a. Hypotensive 7/3 now resolved with fluids b. PNA as etiology NGTD blood/urine cultures 7/2 c. Initially on vancomycin and cefepime transitioned to cefepime and on discharge will complete Levaquin d. MBS  per SLP input 7/4 downgraded to dysphagia 3 diet e. Needs attention and some supervision at times with eating 2. ?  Heart failure-chronic venous congestion liver causing elevated right-sided pressures a. Per cardiologistThrombocytopenia- DDX CVC liver?-Possibly VSD b. Likely he has a combination of findings from right ventricular overload in addition to Depakote use causing liver abnormalities c. No clear etiology- T-max 102.2 night of admission 7/1 d. Ultrasound abdomen pelvis 74 = cholelithiasis but increased echotexture e. Platelet smear shows schistocytes?  Hemolysis f. Follow-up labs in the outpatient setting g. See below 3. Elevated troponin chronic right bundle branch block on EKG 07/2019 a. Positive bubble study as per echocardiogram ordered by Dr. Jomarie Longs-- i. Needs OP TEE for further planning--[facility] b. CT neg for PE but is + for PNA c. Further work-up as per cardiology-would not order VQ scan at this time given alternate explanation as above 4. Hyperammonemia 1. He has a mild tremor which is probably because of this and will need to continue lactulose  that was started this admission 5. AKI on admission 6.  a. 2/2 ACE + diarrhea b. Force fluids, DC IV saline 7/5 and  significantly has improved on discharge 7. Anemia-likely dilutional in the setting of macrocytosis a. No Hemoccult as no bleeding reported 8. Severe anxiety/behavioral issues necessitating institutionalization at a young age a. Depakote can cause hyperammonemia--STOPPING DEPAKOTE b. Mentation is cleared with short-term lactulose-hyperammonemia could have been caused by Depakote  c. Did discontinue meclizine, Marinol d. For now can continue Lexapro, Seroquel 100 3 times daily and will resume at lower doses of Valium at 2.5 instead of five twice daily  Procedures:  TTE   Consultations:  Cardiology  Discharge Exam: Vitals:   01/05/20 2018 01/06/20 0525  BP: (!) 107/51 116/66  Pulse: 72 65  Resp: 20 18  Temp: 98.7 F (37.1 C) 98.1 F (36.7 C)  SpO2: 100% 98%   Doing well sitting up no distress eating and drinking coherent interactive although quiet General: Flat affect pleasant however no distress external ocular movements are intact No gross tremor some past-pointing noted yesterday but is less tremulous today Cardiovascular: S1-S2 no murmur no rub no gallop Respiratory: Clinically clear no added sound Abdomen soft no rebound no guarding  Discharge Instructions   Discharge Instructions    Diet - low sodium heart healthy   Complete by: As directed    Discharge instructions   Complete by: As directed    Complete all antibiotics You will notice some medications were changed and that these medications should be reviewed by the facilities MD-also we will need coordination of planning for heart test in the outpatient setting called a TEE wherein we can look at the chambers of the heart and ensure that there is no need for further procedures to prevent you from having any more liver dyscrasia Cardiology should coordinate this with you You will need labs in about 1 week at facility   Increase activity slowly   Complete by: As directed      Allergies as of 01/06/2020       Reactions   Sulfa Antibiotics Nausea And Vomiting      Medication List    STOP taking these medications   cholestyramine 4 g packet Commonly known as: QUESTRAN   divalproex 125 MG DR tablet Commonly known as: DEPAKOTE   dronabinol 2.5 MG capsule Commonly known as: MARINOL   Ensure   lisinopril 2.5 MG tablet Commonly known as: ZESTRIL   loperamide 2 MG tablet Commonly known as: IMODIUM A-D   meclizine 25 MG tablet Commonly known as: ANTIVERT   nystatin powder Generic drug: nystatin     TAKE these medications   acetaminophen 325 MG tablet Commonly known as: TYLENOL Take 650 mg by mouth every 6 (six) hours as needed for mild pain, moderate pain or fever.   diazepam 5 MG tablet Commonly known as: VALIUM Take 1 tablet (5 mg total) by mouth 2 (two) times daily for 2 days.   escitalopram 10 MG tablet Commonly known as: LEXAPRO Take 1.5 tablets (15 mg total) by mouth daily.   famotidine 10 MG tablet Commonly known as: PEPCID Take 10 mg by mouth 2 (two) times daily as needed for heartburn or indigestion.   lactulose 10 GM/15ML solution Commonly known as: CHRONULAC Take 30 mLs (20 g total) by mouth 2 (two) times daily.   levofloxacin 500 MG tablet Commonly known as: Levaquin Take 1 tablet (500 mg total) by mouth daily for 2 days.   oxybutynin  10 MG 24 hr tablet Commonly known as: DITROPAN-XL Take 10 mg by mouth daily.   potassium chloride SA 20 MEQ tablet Commonly known as: KLOR-CON Take 2 tablets (40 mEq total) by mouth daily. Start taking on: January 07, 2020   QUEtiapine 100 MG tablet Commonly known as: SEROQUEL Take 100 mg by mouth 3 (three) times daily.   vitamin B-12 500 MCG tablet Commonly known as: CYANOCOBALAMIN Take 500 mcg by mouth daily.      Allergies  Allergen Reactions  . Sulfa Antibiotics Nausea And Vomiting      The results of significant diagnostics from this hospitalization (including imaging, microbiology, ancillary and  laboratory) are listed below for reference.    Significant Diagnostic Studies: CT Head Wo Contrast  Result Date: 01/01/2020 CLINICAL DATA:  Fall.  Head injury.  Headache. EXAM: CT HEAD WITHOUT CONTRAST CT CERVICAL SPINE WITHOUT CONTRAST TECHNIQUE: Multidetector CT imaging of the head and cervical spine was performed following the standard protocol without intravenous contrast. Multiplanar CT image reconstructions of the cervical spine were also generated. COMPARISON:  CT head 07/07/2019 FINDINGS: CT HEAD FINDINGS Brain: No evidence of acute infarction, hemorrhage, hydrocephalus, extra-axial collection or mass lesion/mass effect. Vascular: Negative for hyperdense vessel Skull: Negative Sinuses/Orbits: Mild mucosal edema paranasal sinuses. Negative orbit. Other: None CT CERVICAL SPINE FINDINGS Alignment: Mild retrolisthesis C3-4 and C4-5 Skull base and vertebrae: Negative for fracture Soft tissues and spinal canal: No soft tissue swelling or mass. Disc levels: Mild disc and facet degeneration at C4-5. Disc degeneration and uncinate spurring C5-6 and C6-7. No significant stenosis. Upper chest: Lung apices clear bilaterally. Other: None IMPRESSION: 1. No acute intracranial abnormality 2. Negative for cervical spine fracture. Electronically Signed   By: Marlan Palau M.D.   On: 01/01/2020 12:56   CT ANGIO CHEST PE W OR WO CONTRAST  Result Date: 01/02/2020 CLINICAL DATA:  Evaluate for PE EXAM: CT ANGIOGRAPHY CHEST WITH CONTRAST TECHNIQUE: Multidetector CT imaging of the chest was performed using the standard protocol during bolus administration of intravenous contrast. Multiplanar CT image reconstructions and MIPs were obtained to evaluate the vascular anatomy. CONTRAST:  OMNIPAQUE IOHEXOL 350 MG/ML SOLN COMPARISON:  Same-day noncontrast CT chest FINDINGS: Cardiovascular: Examination for pulmonary embolism is somewhat limited by breath motion artifact in the lung bases. Within this limitation, no evidence of  pulmonary embolism through the segmental pulmonary arterial level in the lower lobes and no evidence of embolism in the upper lobes. No evidence of pulmonary embolism. Cardiomegaly. No pericardial effusion. 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 Enlargement of the pulmonary arteries. Mediastinum/Nodes: No enlarged mediastinal, hilar, or axillary lymph nodes. Thyroid gland, trachea, and esophagus demonstrate no significant findings. Lungs/Pleura: Examination is somewhat limited by breath motion artifact. Redemonstrated dependent bibasilar heterogeneous airspace opacity and consolidation, right greater than left. Bronchial wall thickening and frothy debris in the dependent airways of the right lung. Upper Abdomen: No acute abnormality. Musculoskeletal: No chest wall abnormality. No acute or significant osseous findings. Review of the MIP images confirms the above findings. IMPRESSION: 1. Examination for pulmonary embolism is somewhat limited by breath motion artifact in the lung bases. Within this limitation, no evidence of pulmonary embolism through the segmental pulmonary arterial level in the lower lobes and no evidence of embolism in the upper lobes. 2. Redemonstrated dependent bibasilar heterogeneous airspace opacity and consolidation, right greater than left, with bronchial wall thickening and frothy debris in the dependent airways of the right lung. Findings are consistent with  aspiration. 3. Cardiomegaly. 4. Enlargement of the pulmonary arteries, as can be seen with pulmonary arterial hypertension. Electronically Signed   By: Lauralyn Primes M.D.   On: 01/02/2020 17:12   CT Cervical Spine Wo Contrast  Result Date: 01/01/2020 CLINICAL DATA:  Fall.  Head injury.  Headache. EXAM: CT HEAD WITHOUT CONTRAST CT CERVICAL SPINE WITHOUT CONTRAST TECHNIQUE: Multidetector CT imaging of the head and cervical spine was performed following the standard protocol without intravenous  contrast. Multiplanar CT image reconstructions of the cervical spine were also generated. COMPARISON:  CT head 07/07/2019 FINDINGS: CT HEAD FINDINGS Brain: No evidence of acute infarction, hemorrhage, hydrocephalus, extra-axial collection or mass lesion/mass effect. Vascular: Negative for hyperdense vessel Skull: Negative Sinuses/Orbits: Mild mucosal edema paranasal sinuses. Negative orbit. Other: None CT CERVICAL SPINE FINDINGS Alignment: Mild retrolisthesis C3-4 and C4-5 Skull base and vertebrae: Negative for fracture Soft tissues and spinal canal: No soft tissue swelling or mass. Disc levels: Mild disc and facet degeneration at C4-5. Disc degeneration and uncinate spurring C5-6 and C6-7. No significant stenosis. Upper chest: Lung apices clear bilaterally. Other: None IMPRESSION: 1. No acute intracranial abnormality 2. Negative for cervical spine fracture. Electronically Signed   By: Marlan Palau M.D.   On: 01/01/2020 12:56   US Abdomen Complete  Result Date: 01/04/2020 CLINICAL DATA:  Liver cirrhosis. EXAM: ABDOMEN ULTRASOUND COMPLETE COMPARISON:  CT chest on 01/02/2020, ultrasound the abdomen 11/29/2011 FINDINGS: Gallbladder: Gallbladder wall is normal in thickness, 2.1 millimeters. No sonographic Murphy's sign. Numerous calculi are identified, measuring on the order of 9 millimeters. Common bile duct: Diameter: 2.4 millimeters Liver: Coarsened echotexture. Normal liver morphology. No focal liver lesions. Portal vein is patent on color Doppler imaging with normal direction of blood flow towards the liver. IVC: No abnormality visualized. Pancreas: Obscured by bowel gas. Spleen: Size and appearance within normal limits. Right Kidney: Length: 11.1 centimeter. Echogenicity within normal limits. No mass or hydronephrosis visualized Left Kidney: Length: 11.0 centimeter. Echogenicity within normal limits. No mass or hydronephrosis visualized. Abdominal aorta: No aneurysm visualized. Other findings: Small amount of  perihepatic fluid. Small bilateral pleural effusions. IMPRESSION: 1. Cholelithiasis. 2. No focal liver lesions. Coarsened echotexture of the liver without other features of hepatic steatosis or cirrhosis. 3. No hydronephrosis. Electronically Signed   By: Norva Pavlov M.D.   On: 01/04/2020 19:37   CT Chest High Resolution  Result Date: 01/02/2020 CLINICAL DATA:  Found down, unresponsive, pulmonary embolism EXAM: CT CHEST WITHOUT CONTRAST TECHNIQUE: Multidetector CT imaging of the chest was performed following the standard protocol without intravenous contrast. High resolution imaging of the lungs, as well as inspiratory and expiratory imaging, was performed. COMPARISON:  None. FINDINGS: Cardiovascular: Enlargement of the pulmonary arteries, the main pulmonary artery measuring approximately 3.0 cm, the right pulmonary artery measuring 3.7 cm and the left pulmonary artery measuring 3.5 cm. Normal heart size. No pericardial effusion. Mediastinum/Nodes: No enlarged mediastinal, hilar, or axillary lymph nodes. Thyroid gland, trachea, and esophagus demonstrate no significant findings. Lungs/Pleura: Examination is somewhat limited by breath motion artifact. There is dependent bibasilar heterogeneous airspace opacity and consolidation, right greater than left. Bronchial wall thickening and frothy debris in the dependent airways of the right lung. No evidence of fibrotic interstitial lung disease. No significant air trapping on expiratory phase imaging. Upper Abdomen: No acute abnormality. Musculoskeletal: No chest wall mass or suspicious bone lesions identified. IMPRESSION: 1. There is dependent bibasilar heterogeneous airspace opacity and consolidation, right greater than left, with bronchial wall thickening and frothy debris in the  dependent airways of the right lung. Findings are consistent with aspiration. 2. No evidence of fibrotic interstitial lung disease. 3. Enlargement of the pulmonary arteries, as can be  seen in pulmonary hypertension. 4. Please note that this examination is nondiagnostic for pulmonary embolism. Consider CT pulmonary angiogram. Electronically Signed   By: Lauralyn Primes M.D.   On: 01/02/2020 15:33   DG Chest Port 1 View  Result Date: 01/01/2020 CLINICAL DATA:  Increasing weakness. EXAM: PORTABLE CHEST 1 VIEW COMPARISON:  Single-view of the chest 07/07/2019. FINDINGS: Lungs clear. Heart size normal. No pneumothorax or pleural fluid. No acute or focal bony abnormality. IMPRESSION: Negative chest. Electronically Signed   By: Drusilla Kanner M.D.   On: 01/01/2020 12:29   DG Swallowing Func-Speech Pathology  Result Date: 01/05/2020 Objective Swallowing Evaluation: Type of Study: MBS-Modified Barium Swallow Study  Patient Details Name: LAYTH CEREZO MRN: [id number] Date of Birth: [birth year] Today's Date: 01/05/2020 Time: SLP Start Time (ACUTE ONLY): 1325 -SLP Stop Time (ACUTE ONLY): 1345 SLP Time Calculation (min) (ACUTE ONLY): 20 min Past Medical History: Past Medical History: Diagnosis Date . Anxiety  . Depression  . GERD (gastroesophageal reflux disease)  . HTN (hypertension)  . RBBB  Past Surgical History: No past surgical history on file. HPI: 59 y.o. male with medical history significant of hypertension, depression, anxiety, GERD lives at assisted living presents in the emergency department with generalized weakness and lightheadedness. Patient reports that he was getting up to go to the activity room.  When he stood up he got very weak and fell down.  Pt admitted with weakness, dehydration, hypotension, and AKI; BSE completed on 01/04/20 indicating need for MBS; pt placed on Dysphagia 3/thin liquid diet.  No data recorded Assessment / Plan / Recommendation CHL IP CLINICAL IMPRESSIONS 01/05/2020 Clinical Impression Pt presents with mild oropharyngeal dysphagia characterized by oral tremoring/min oral holding with premature spillage to vallecular space and one incidence of spillage into pyriform  sinuses;   penetration noted during swallow with thin via cup/straw, but material remained above the airway and was ejected out without compensatory strategies required; pt stated he "eats fast" during meal and paired with cognitive impairment/discoordination of swallow, places him at mild-moderate risk for aspiration.  Recommend ST f/u for education with pt/family (brother) and caregivers re: importance of rate of intake/swallowing precautions during PO intake; pt in agreement with swallow precautions, but due to cognitive impairment/potential impulsivity, may not be capable of implementing during meals.  Dysphagia 3 (chopped)/thin liquids via cup/straw with smaller sips recommended for optimal safety with swallowing. SLP Visit Diagnosis Dysphagia, oropharyngeal phase (R13.12) Attention and concentration deficit following -- Frontal lobe and executive function deficit following -- Impact on safety and function Mild aspiration risk;Moderate aspiration risk;Other (without precautions)   CHL IP TREATMENT RECOMMENDATION 01/05/2020 Treatment Recommendations Therapy as outlined in treatment plan below   Prognosis 01/05/2020 Prognosis for Safe Diet Advancement Good Barriers to Reach Goals Cognitive deficits Barriers/Prognosis Comment -- CHL IP DIET RECOMMENDATION 01/05/2020 SLP Diet Recommendations Dysphagia 3 (Mech soft) solids;Thin liquid Liquid Administration via Cup;Straw;Other (Comment) Medication Administration Whole meds with puree Compensations Slow rate;Small sips/bites Postural Changes Seated upright at 90 degrees   CHL IP OTHER RECOMMENDATIONS 01/05/2020 Recommended Consults -- Oral Care Recommendations Oral care BID Other Recommendations --   CHL IP FOLLOW UP RECOMMENDATIONS 01/05/2020 Follow up Recommendations Other (comment)   CHL IP FREQUENCY AND DURATION 01/05/2020 Speech Therapy Frequency (ACUTE ONLY) min 1 x/week Treatment Duration 1 week      CHL  IP ORAL PHASE 01/05/2020 Oral Phase Impaired Oral - Pudding Teaspoon --  Oral - Pudding Cup -- Oral - Honey Teaspoon -- Oral - Honey Cup -- Oral - Nectar Teaspoon -- Oral - Nectar Cup -- Oral - Nectar Straw -- Oral - Thin Teaspoon -- Oral - Thin Cup Holding of bolus;Premature spillage;Other (Comment) Oral - Thin Straw Other (Comment);Premature spillage Oral - Puree Other (Comment) Oral - Mech Soft Delayed oral transit;Other (Comment) Oral - Regular -- Oral - Multi-Consistency -- Oral - Pill -- Oral Phase - Comment (No Data)  CHL IP PHARYNGEAL PHASE 01/05/2020 Pharyngeal Phase Impaired Pharyngeal- Pudding Teaspoon -- Pharyngeal -- Pharyngeal- Pudding Cup -- Pharyngeal -- Pharyngeal- Honey Teaspoon -- Pharyngeal -- Pharyngeal- Honey Cup -- Pharyngeal -- Pharyngeal- Nectar Teaspoon -- Pharyngeal -- Pharyngeal- Nectar Cup -- Pharyngeal -- Pharyngeal- Nectar Straw -- Pharyngeal -- Pharyngeal- Thin Teaspoon -- Pharyngeal -- Pharyngeal- Thin Cup Penetration/Aspiration during swallow;Delayed swallow initiation-vallecula Pharyngeal Material enters airway, remains ABOVE vocal cords then ejected out Pharyngeal- Thin Straw Delayed swallow initiation-vallecula;Delayed swallow initiation-pyriform sinuses;Penetration/Aspiration during swallow Pharyngeal Material enters airway, remains ABOVE vocal cords then ejected out Pharyngeal- Puree Delayed swallow initiation-vallecula Pharyngeal -- Pharyngeal- Mechanical Soft Delayed swallow initiation-vallecula Pharyngeal -- Pharyngeal- Regular -- Pharyngeal -- Pharyngeal- Multi-consistency -- Pharyngeal -- Pharyngeal- Pill Other (Comment) Pharyngeal -- Pharyngeal Comment --  CHL IP CERVICAL ESOPHAGEAL PHASE 01/05/2020 Cervical Esophageal Phase WFL Pudding Teaspoon -- Pudding Cup -- Honey Teaspoon -- Honey Cup -- Nectar Teaspoon -- Nectar Cup -- Nectar Straw -- Thin Teaspoon -- Thin Cup -- Thin Straw -- Puree -- Mechanical Soft -- Regular -- Multi-consistency -- Pill -- Cervical Esophageal Comment -- Tressie Stalker, M.S., CCC-SLP 01/05/2020, 3:54 PM               ECHOCARDIOGRAM COMPLETE  Result Date: 01/02/2020    ECHOCARDIOGRAM REPORT   Patient Name:   Jeremy Wu Date of Exam: 01/02/2020 Medical Rec #:  [id number]          Height:       73.0 in Accession #:    [id number]         Weight:       163.1 lb Date of Birth:  Sep 13, 1960          BSA:          1.973 m Patient Age:    59 years           BP:           100/58 mmHg Patient Gender: M                  HR:           70 bpm. Exam Location:  Inpatient Procedure: 2D Echo                                 MODIFIED REPORT:    This report was modified by Weston Brass MD on 01/02/2020 due to critical                              result communication.  Indications:     abnormal ECG 794.31  History:         Patient has no prior history of Echocardiogram examinations.                  Risk Factors:Hypertension. RBBB.  Sonographer:  Celene Skeen RDCS (AE) Referring Phys:  [id number] Cipriano Bunker Diagnosing Phys: Weston Brass MD  Sonographer Comments: limited mobility IMPRESSIONS  1. Right ventricular systolic function is mildly reduced. The right ventricular size is severely enlarged. There is moderately elevated pulmonary artery systolic pressure. The estimated right ventricular systolic pressure is 47.7 mmHg.  2. Left ventricular ejection fraction, by estimation, is 60 to 65%. The left ventricle has normal function. The left ventricle has no regional wall motion abnormalities. Left ventricular diastolic parameters were normal. There is the interventricular septum is flattened in systole, consistent with right ventricular pressure overload.  3. Right atrial size was severely dilated.  4. The mitral valve is normal in structure. Trivial mitral valve regurgitation. No evidence of mitral stenosis.  5. The aortic valve was not well visualized. Aortic valve regurgitation is not visualized. No aortic stenosis is present.  6. The inferior vena cava is dilated in size with <50% respiratory variability, suggesting right atrial  pressure of 15 mmHg.  7. Cannot exclude atrial level shunt with right to left shunt based on image 70. Conclusion(s)/Recommendation(s): Severely dilated right atrium and right ventricle. Consider CT angiography of pulmonary arteries to exclude PE, and focused echo with agitated saline to fully evaluate atrial septum in setting of right sided chamber enlargement. Critical results communicated and acknowledged by Dr. Christopher/Cardiology at 10:21 am 01/02/20. FINDINGS  Left Ventricle: Left ventricular ejection fraction, by estimation, is 60 to 65%. The left ventricle has normal function. The left ventricle has no regional wall motion abnormalities. The left ventricular internal cavity size was normal in size. There is  no left ventricular hypertrophy. The interventricular septum is flattened in systole, consistent with right ventricular pressure overload. Left ventricular diastolic parameters were normal. Right Ventricle: The right ventricular size is severely enlarged. No increase in right ventricular wall thickness. Right ventricular systolic function is mildly reduced. There is moderately elevated pulmonary artery systolic pressure. The tricuspid regurgitant velocity is 2.86 m/s, and with an assumed right atrial pressure of 15 mmHg, the estimated right ventricular systolic pressure is 47.7 mmHg. Left Atrium: Left atrial size was normal in size. Right Atrium: Right atrial size was severely dilated. Pericardium: There is no evidence of pericardial effusion. Mitral Valve: The mitral valve is normal in structure. Normal mobility of the mitral valve leaflets. Trivial mitral valve regurgitation. No evidence of mitral valve stenosis. Tricuspid Valve: The tricuspid valve is normal in structure. Tricuspid valve regurgitation is trivial. No evidence of tricuspid stenosis. Aortic Valve: The aortic valve was not well visualized. Aortic valve regurgitation is not visualized. No aortic stenosis is present. Pulmonic Valve: The  pulmonic valve was grossly normal. Pulmonic valve regurgitation is trivial. No evidence of pulmonic stenosis. Aorta: The aortic root is normal in size and structure. Venous: The inferior vena cava is dilated in size with less than 50% respiratory variability, suggesting right atrial pressure of 15 mmHg. IAS/Shunts: Cannot exclude atrial level shunt with right to left shunt based on image 70.  LEFT VENTRICLE PLAX 2D LVIDd:         2.90 cm  Diastology LVIDs:         1.90 cm  LV e' lateral:   8.59 cm/s LV PW:         1.00 cm  LV E/e' lateral: 8.5 LV IVS:        0.80 cm  LV e' medial:    8.70 cm/s LVOT diam:     2.10 cm  LV E/e' medial:  8.4 LV  SV:         44 LV SV Index:   22 LVOT Area:     3.46 cm  RIGHT VENTRICLE RV S prime:     12.90 cm/s TAPSE (M-mode): 2.1 cm LEFT ATRIUM         Index      RIGHT ATRIUM           Index LA diam:    2.50 cm 1.27 cm/m RA Area:     22.60 cm                                RA Volume:   75.90 ml  38.47 ml/m  AORTIC VALVE LVOT Vmax:   62.70 cm/s LVOT Vmean:  48.700 cm/s LVOT VTI:    0.126 m  AORTA Ao Root diam: 3.00 cm MITRAL VALVE               TRICUSPID VALVE MV Area (PHT): 3.93 cm    TR Peak grad:   32.7 mmHg MV Decel Time: 193 msec    TR Vmax:        286.00 cm/s MV E velocity: 72.80 cm/s MV A velocity: 54.00 cm/s  SHUNTS MV E/A ratio:  1.35        Systemic VTI:  0.13 m                            Systemic Diam: 2.10 cm Weston Brass MD Electronically signed by Weston Brass MD Signature Date/Time: 01/02/2020/10:20:00 AM    Final (Updated)    ECHOCARDIOGRAM LIMITED BUBBLE STUDY  Result Date: 01/05/2020    ECHOCARDIOGRAM LIMITED REPORT   Patient Name:   Jeremy Wu Date of Exam: 01/05/2020 Medical Rec #:  [id number]          Height:       73.0 in Accession #:    [id number]         Weight:       173.9 lb Date of Birth:  13-Feb-1961          BSA:          2.028 m Patient Age:    59 years           BP:           120/66 mmHg Patient Gender: M                  HR:           72  bpm. 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  Sonographer:    Tiffany Dance Referring Phys: [id number] MIHAI CROITORU IMPRESSIONS  1. Limited saline contrast study. Right ventricle and right atrium are dilated. 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 TEE could better exclude sinus venosus defect. FINDINGS  IAS/Shunts: Agitated saline contrast was given intravenously to evaluate for intracardiac shunting. Nona Dell MD Electronically signed by Nona Dell MD Signature Date/Time: 01/05/2020/3:17:00  PM    Final     Microbiology: Recent Results (from the past 240 hour(s))  SARS Coronavirus 2 by RT PCR (hospital order, performed in [facility] hospital lab) Nasopharyngeal Nasopharyngeal Swab     Status: None   Collection Time: 01/01/20  5:56 PM   Specimen: Nasopharyngeal Swab  Result Value Ref Range Status   SARS Coronavirus 2 NEGATIVE NEGATIVE Final    Comment: (NOTE) SARS-CoV-2 target nucleic acids are NOT DETECTED.  The SARS-CoV-2 RNA is generally detectable in upper and lower respiratory specimens during the acute phase of infection. The lowest concentration of SARS-CoV-2 viral copies this assay can detect is 250 copies / mL. A negative result does not preclude SARS-CoV-2 infection and should not be used as the sole basis for treatment or other patient management decisions.  A negative result may occur with improper specimen collection / handling, submission of specimen other than nasopharyngeal swab, presence of viral mutation(s) within the areas targeted by this assay, and inadequate number of viral copies (<250  copies / mL). A negative result must be combined with clinical observations, patient history, and epidemiological information.  Fact Sheet for Patients:   BoilerBrush.com.cy  Fact Sheet for Healthcare Providers: https://pope.com/  This test is not yet approved or  cleared by the [place] FDA and has been authorized for detection and/or diagnosis of SARS-CoV-2 by FDA under an Emergency Use Authorization (EUA).  This EUA will remain in effect (meaning this test can be used) for the duration of the COVID-19 declaration under Section 564(b)(1) of the Act, 21 U.S.C. section 360bbb-3(b)(1), unless the authorization is terminated or revoked sooner.  Performed at [facility], [address], [place], [place] [address]   Culture, Urine     Status: None   Collection Time: 01/02/20 11:42 AM   Specimen: Urine, Catheterized  Result Value Ref Range Status   Specimen Description   Final    URINE, CATHETERIZED Performed at [facility] [address], [place], [place] [address]    Special Requests   Final    NONE Performed at [facility], [address], [place], [place] [address]    Culture   Final    NO GROWTH Performed at [facility], [address], [place], [place] [address]    Report Status 01/03/2020 FINAL  Final  Culture, blood (Routine X 2) w Reflex to ID Panel     Status: None (Preliminary result)   Collection Time: 01/02/20 12:15 PM   Specimen: BLOOD LEFT HAND  Result Value Ref Range Status   Specimen Description   Final    BLOOD LEFT HAND Performed at [facility], [address], [place], [place] [address]    Special Requests   Final    BOTTLES DRAWN AEROBIC AND ANAEROBIC Blood Culture adequate volume Performed at [facility], [address], [place], [place] [address]    Culture   Final    NO GROWTH 3  DAYS Performed at [facility], [address], [place], [place] [address]    Report Status PENDING  Incomplete  Culture, blood (Routine X 2) w Reflex to ID Panel     Status: None (Preliminary result)   Collection Time: 01/02/20 12:16 PM   Specimen: BLOOD  Result Value Ref Range Status   Specimen Description   Final    BLOOD RIGHT ARM Performed at [facility], [address] [place], [place]  [id number]    Special Requests   Final    BOTTLES DRAWN AEROBIC ONLY Blood Culture adequate volume Performed at [facility], [address] [place], [place] [id number]    Culture   Final    NO GROWTH 3 DAYS Performed at [facility], [address] [place], [place] [address]    Report Status PENDING  Incomplete     Labs: Basic Metabolic Panel: Recent Labs  Lab 01/02/20 0445 01/03/20 0427 01/04/20 0802 01/05/20 0531 01/06/20 0503  NA 142 137 143 143 141  K 3.4* 3.3* 3.5 3.3* 3.4*  CL 111 104 105 109 105  CO2 25 25 27 27 27   GLUCOSE 102* 104* 88 89 87  BUN 22* 16 9 10 10   CREATININE 0.89 0.78 0.69 0.67 0.61  CALCIUM 8.2* 7.6* 8.0* 8.4* 8.3*   Liver Function Tests: Recent Labs  Lab 01/02/20 0445 01/02/20 0445 01/03/20 0427 01/03/20 1657 01/04/20 0802 01/05/20 0531 01/06/20 0503  AST 15  --  17  --  21 20 20   ALT 10  --  11  --  12 14 13   ALKPHOS 28*  --  26*  --  27* 34* 35*  BILITOT 1.0   < > 0.8 0.9 0.9 0.8 0.6  PROT 5.3*  --  4.8*  --  5.2* 5.2* 5.1*  ALBUMIN 3.1*  --  2.7*  --  2.8* 2.7* 2.6*   < > = values in this interval not displayed.   Recent Labs  Lab 01/01/20 1108  LIPASE 18   Recent Labs  Lab 01/03/20 0427  AMMONIA 51*   CBC: Recent Labs  Lab 01/01/20 1108 01/02/20 0445 01/03/20 0427 01/04/20 0802 01/05/20 0531  WBC 6.8 6.6 5.0 3.6* 3.4*  NEUTROABS 4.6  --  3.0 1.9 1.5*  HGB 11.7* 9.9* 9.4* 10.5* 10.6*  HCT 35.4* 29.3* 28.6* 31.3* 31.9*  MCV 97.0 94.2 95.7 95.4 94.4  PLT 83* 77* 66* 68*  90*   Cardiac Enzymes: No results for input(s): CKTOTAL, CKMB, CKMBINDEX, TROPONINI in the last 168 hours. BNP: BNP (last 3 results) Recent Labs    01/01/20 1108  BNP 277.1*    ProBNP (last 3 results) No results for input(s): PROBNP in the last 8760 hours.  CBG: No results for input(s): GLUCAP in the last 168 hours.     Signed:  Rhetta MuraJai-Gurmukh Shaketha Jeon MD   [facility] 01/06/2020, 9:57 AM

## 2020-01-06 NOTE — Telephone Encounter (Signed)
Patient is still currently admitted. 

## 2020-01-06 NOTE — Progress Notes (Signed)
BP 92/53 Pulse 77. MD made ware of Vital Signs. Pt's assessment is without acute changes at this time. Maintain current plan of care for the Pt.

## 2020-01-06 NOTE — NC FL2 (Deleted)
[place] MEDICAID FL2 LEVEL OF CARE SCREENING TOOL     IDENTIFICATION  Patient Name: Jeremy Wu Birthdate: [birth year] Sex: male Admission Date (Current Location): 01/01/2020  [facility] and [place] Number:  Producer, television/film/video and Address:  [facility],  [address] [place] [address]      Provider Number: [id number]  Attending Physician Name and Address:  Rhetta Mura, MD  Relative Name and Phone Number:       Current Level of Care: Hospital Recommended Level of Care: Assisted Living Facility Prior Approval Number:    Date Approved/Denied:   PASRR Number:    Discharge Plan: Other (Comment) (ALF)    Current Diagnoses: Patient Active Problem List   Diagnosis Date Noted   Intermittent lightheadedness 01/01/2020   MDD (major depressive disorder), recurrent severe, without psychosis (HCC) 06/10/2016   Acute pancreatitis 11/29/2011   Generalized anxiety disorder 11/29/2011    Orientation RESPIRATION BLADDER Height & Weight     Self, Time, Situation, Place  Normal Incontinent Weight: 173 lb 15.1 oz (78.9 kg) Height:  6\' 1"  (185.4 cm)  BEHAVIORAL SYMPTOMS/MOOD NEUROLOGICAL BOWEL NUTRITION STATUS      Incontinent Diet (Soft Diet, No added salt, no concentrated sweets)  AMBULATORY STATUS COMMUNICATION OF NEEDS Skin   Supervision Verbally Normal                       Personal Care Assistance Level of Assistance  Bathing, Feeding, Dressing Bathing Assistance: Limited assistance Feeding assistance: Independent Dressing Assistance: Limited assistance     Functional Limitations Info  Sight, Hearing, Speech Sight Info: Adequate Hearing Info: Adequate Speech Info: Adequate    SPECIAL CARE FACTORS FREQUENCY                       Contractures Contractures Info: Not present    Additional Factors Info  Code Status, Allergies Code Status Info: Full Allergies Info: Sulfa Antibiotics           Current Medications  (01/06/2020):  This is the current hospital active medication list Current Facility-Administered Medications  Medication Dose Route Frequency Provider Last Rate Last Admin   0.9 %  sodium chloride infusion   Intravenous Continuous 03/08/2020, MD 50 mL/hr at 01/06/20 0200 Rate Verify at 01/06/20 0200   acetaminophen (TYLENOL) tablet 650 mg  650 mg Oral Q6H PRN 03/08/20, MD   650 mg at 01/02/20 03/04/20   Or   acetaminophen (TYLENOL) suppository 650 mg  650 mg Rectal Q6H PRN 6333, MD       ceFEPIme (MAXIPIME) 2 g in sodium chloride 0.9 % 100 mL IVPB  2 g Intravenous Q8H Wofford, Drew A, RPH 200 mL/hr at 01/06/20 0523 2 g at 01/06/20 0523   cholestyramine (QUESTRAN) packet 2 g  2 g Oral Once per day on Mon Wed Fri 12-07-1969, MD   2 g at 01/05/20 2237   diazepam (VALIUM) tablet 2 mg  2 mg Oral Q12H 2238, MD   2 mg at 01/06/20 0924   escitalopram (LEXAPRO) tablet 15 mg  15 mg Oral Daily 03/08/20, MD   15 mg at 01/06/20 0924   famotidine (PEPCID) tablet 10 mg  10 mg Oral BID PRN 03/08/20, MD       lactulose (CHRONULAC) 10 GM/15ML solution 20 g  20 g Oral BID Cipriano Bunker, MD   20 g at 01/06/20 0923   meclizine (ANTIVERT) tablet 12.5  mg  12.5 mg Oral Daily PRN Rhetta Mura, MD       ondansetron [facility]) tablet 4 mg  4 mg Oral Q6H PRN Cipriano Bunker, MD       Or   ondansetron [facility]) injection 4 mg  4 mg Intravenous Q6H PRN Cipriano Bunker, MD       oxybutynin (DITROPAN-XL) 24 hr tablet 10 mg  10 mg Oral Daily Cipriano Bunker, MD   10 mg at 01/06/20 1448   potassium chloride SA (KLOR-CON) CR tablet 40 mEq  40 mEq Oral Daily Rhetta Mura, MD   40 mEq at 01/06/20 1856   QUEtiapine (SEROQUEL) tablet 100 mg  100 mg Oral TID Cipriano Bunker, MD   100 mg at 01/06/20 3149     Discharge Medications:  STOP taking these medications   cholestyramine 4 g packet Commonly known as: QUESTRAN   divalproex 125 MG DR  tablet Commonly known as: DEPAKOTE   dronabinol 2.5 MG capsule Commonly known as: MARINOL   Ensure   lisinopril 2.5 MG tablet Commonly known as: ZESTRIL   loperamide 2 MG tablet Commonly known as: IMODIUM A-D   meclizine 25 MG tablet Commonly known as: ANTIVERT   nystatin powder Generic drug: nystatin     TAKE these medications   acetaminophen 325 MG tablet Commonly known as: TYLENOL Take 650 mg by mouth every 6 (six) hours as needed for mild pain, moderate pain or fever.   diazepam 5 MG tablet Commonly known as: VALIUM Take 1 tablet (5 mg total) by mouth 2 (two) times daily for 2 days.   escitalopram 10 MG tablet Commonly known as: LEXAPRO Take 1.5 tablets (15 mg total) by mouth daily.   famotidine 10 MG tablet Commonly known as: PEPCID Take 10 mg by mouth 2 (two) times daily as needed for heartburn or indigestion.   lactulose 10 GM/15ML solution Commonly known as: CHRONULAC Take 30 mLs (20 g total) by mouth 2 (two) times daily.   levofloxacin 500 MG tablet Commonly known as: Levaquin Take 1 tablet (500 mg total) by mouth daily for 2 days.   oxybutynin 10 MG 24 hr tablet Commonly known as: DITROPAN-XL Take 10 mg by mouth daily.   potassium chloride SA 20 MEQ tablet Commonly known as: KLOR-CON Take 2 tablets (40 mEq total) by mouth daily. Start taking on: January 07, 2020   QUEtiapine 100 MG tablet Commonly known as: SEROQUEL Take 100 mg by mouth 3 (three) times daily.   vitamin B-12 500 MCG tablet Commonly known as: CYANOCOBALAMIN Take 500 mcg by mouth daily.          Allergies  Allergen Reactions   Sulfa Antibiotics Nausea And Vomiting       Relevant Imaging Results:  Relevant Lab Results:   Additional Information Please arrange for PT and OT to evaluate and treat at the ALF  Elliot Gault, LCSW

## 2020-01-07 LAB — CULTURE, BLOOD (ROUTINE X 2)
Culture: NO GROWTH
Culture: NO GROWTH
Special Requests: ADEQUATE
Special Requests: ADEQUATE

## 2020-01-07 NOTE — Telephone Encounter (Signed)
Called patient, LVM to call back to discuss TOC questions.

## 2020-01-08 NOTE — Telephone Encounter (Signed)
Left message for pt to call.

## 2020-01-09 NOTE — Telephone Encounter (Signed)
Lm for pt to call back ./cy 

## 2020-01-13 ENCOUNTER — Telehealth: Payer: Self-pay | Admitting: Adult Health

## 2020-01-13 NOTE — Telephone Encounter (Signed)
New message   Patient's brother is returning your call. Please call.

## 2020-01-13 NOTE — Telephone Encounter (Signed)
Noted appointment change.  Called brother back to go over TOC questions- LVM to call back to discuss.

## 2020-01-13 NOTE — Telephone Encounter (Signed)
Follow up   Pt's brother called, he said pt wont have transportation and r/s TOC on 08/06 at 2:15

## 2020-01-13 NOTE — Telephone Encounter (Signed)
Called brother- he states that he will go to facility where his brother is staying to get discharge information- he is his caregiver but does not know if there were concerns.  They will call back if questions.

## 2020-01-15 NOTE — Telephone Encounter (Signed)
Left message on brother ed's voicemail to return the call.

## 2020-01-19 ENCOUNTER — Telehealth: Payer: Self-pay | Admitting: Adult Health

## 2020-01-19 NOTE — Telephone Encounter (Signed)
Left message for [facility] to return the call. Patient has a follow up TOC appointment on 8/6 with NP.

## 2020-01-19 NOTE — Telephone Encounter (Signed)
Tasha from [employer] called to see what she needed to do to get the patient's TEE scheduled. The patient advocate for the Nursing Home Facility reached out to [facility] to get this procedure scheduled.  Rodney Booze would like to get this done as an outpatient procedure

## 2020-01-21 ENCOUNTER — Ambulatory Visit: Payer: BC Managed Care – PPO | Admitting: Adult Health

## 2020-01-22 NOTE — Telephone Encounter (Signed)
Left message at [facility] to call

## 2020-02-06 ENCOUNTER — Ambulatory Visit: Payer: BC Managed Care – PPO | Admitting: Adult Health

## 2020-02-26 ENCOUNTER — Emergency Department (HOSPITAL_COMMUNITY): Payer: BC Managed Care – PPO

## 2020-02-26 ENCOUNTER — Emergency Department (HOSPITAL_COMMUNITY)
Admission: EM | Admit: 2020-02-26 | Discharge: 2020-02-27 | Disposition: A | Discharge status: Home / Self Care | Payer: BC Managed Care – PPO | Attending: Emergency Medicine | Admitting: Emergency Medicine

## 2020-02-26 DIAGNOSIS — I10 Essential (primary) hypertension: Secondary | ICD-10-CM | POA: Diagnosis not present

## 2020-02-26 DIAGNOSIS — Z79899 Other long term (current) drug therapy: Secondary | ICD-10-CM | POA: Diagnosis not present

## 2020-02-26 DIAGNOSIS — Z20822 Contact with and (suspected) exposure to covid-19: Secondary | ICD-10-CM | POA: Insufficient documentation

## 2020-02-26 DIAGNOSIS — N390 Urinary tract infection, site not specified: Secondary | ICD-10-CM

## 2020-02-26 DIAGNOSIS — R509 Fever, unspecified: Secondary | ICD-10-CM | POA: Insufficient documentation

## 2020-02-26 LAB — CBC WITH DIFFERENTIAL/PLATELET
Abs Immature Granulocytes: 0.05 10*3/uL (ref 0.00–0.07)
Basophils Absolute: 0 10*3/uL (ref 0.0–0.1)
Basophils Relative: 0 %
Eosinophils Absolute: 0.1 10*3/uL (ref 0.0–0.5)
Eosinophils Relative: 1 %
HCT: 34.2 % — ABNORMAL LOW (ref 39.0–52.0)
Hemoglobin: 11.5 g/dL — ABNORMAL LOW (ref 13.0–17.0)
Immature Granulocytes: 0 %
Lymphocytes Relative: 12 %
Lymphs Abs: 1.5 10*3/uL (ref 0.7–4.0)
MCH: 31.9 pg (ref 26.0–34.0)
MCHC: 33.6 g/dL (ref 30.0–36.0)
MCV: 94.7 fL (ref 80.0–100.0)
Monocytes Absolute: 1.7 10*3/uL — ABNORMAL HIGH (ref 0.1–1.0)
Monocytes Relative: 14 %
Neutro Abs: 8.9 10*3/uL — ABNORMAL HIGH (ref 1.7–7.7)
Neutrophils Relative %: 73 %
Platelets: 121 10*3/uL — ABNORMAL LOW (ref 150–400)
RBC: 3.61 MIL/uL — ABNORMAL LOW (ref 4.22–5.81)
RDW: 13.7 % (ref 11.5–15.5)
WBC: 12.3 10*3/uL — ABNORMAL HIGH (ref 4.0–10.5)
nRBC: 0 % (ref 0.0–0.2)

## 2020-02-26 LAB — URINALYSIS, ROUTINE W REFLEX MICROSCOPIC
Glucose, UA: NEGATIVE mg/dL
Hgb urine dipstick: NEGATIVE
Ketones, ur: NEGATIVE mg/dL
Nitrite: NEGATIVE
Protein, ur: 100 mg/dL — AB
Specific Gravity, Urine: 1.034 — ABNORMAL HIGH (ref 1.005–1.030)
WBC, UA: >50 WBC/hpf — ABNORMAL HIGH (ref 0–5)
pH: 5 (ref 5.0–8.0)

## 2020-02-26 LAB — PROTIME-INR
INR: 1.1 (ref 0.8–1.2)
Prothrombin Time: 13.6 seconds (ref 11.4–15.2)

## 2020-02-26 LAB — COMPREHENSIVE METABOLIC PANEL
ALT: 9 U/L (ref 0–44)
AST: 20 U/L (ref 15–41)
Albumin: 4.2 g/dL (ref 3.5–5.0)
Alkaline Phosphatase: 78 U/L (ref 38–126)
Anion gap: 9 (ref 5–15)
BUN: 20 mg/dL (ref 6–20)
CO2: 29 mmol/L (ref 22–32)
Calcium: 9.6 mg/dL (ref 8.9–10.3)
Chloride: 101 mmol/L (ref 98–111)
Creatinine, Ser: 0.94 mg/dL (ref 0.61–1.24)
GFR calc Af Amer: >60 mL/min (ref >60)
GFR calc non Af Amer: >60 mL/min (ref >60)
Glucose, Bld: 118 mg/dL — ABNORMAL HIGH (ref 70–99)
Potassium: 4.4 mmol/L (ref 3.5–5.1)
Sodium: 139 mmol/L (ref 135–145)
Total Bilirubin: 1.6 mg/dL — ABNORMAL HIGH (ref 0.3–1.2)
Total Protein: 7.5 g/dL (ref 6.5–8.1)

## 2020-02-26 LAB — SARS CORONAVIRUS 2 BY RT PCR (HOSPITAL ORDER, PERFORMED IN ~~LOC~~ HOSPITAL LAB): SARS Coronavirus 2: NEGATIVE

## 2020-02-26 LAB — APTT: aPTT: 20 seconds — ABNORMAL LOW (ref 24–36)

## 2020-02-26 LAB — LACTIC ACID, PLASMA: Lactic Acid, Venous: 1.3 mmol/L (ref 0.5–1.9)

## 2020-02-26 MED ORDER — CEPHALEXIN 500 MG PO CAPS: 500.0000 mg | ORAL_CAPSULE | Freq: Three times a day (TID) | ORAL | 0 refills | Status: AC

## 2020-02-26 MED ORDER — ACETAMINOPHEN 325 MG PO TABS
650.0000 mg | ORAL_TABLET | Freq: Once | ORAL | Status: AC
  Administered 2020-02-26: 650 mg via ORAL
  Filled 2020-02-26: qty 2

## 2020-02-26 MED ORDER — SODIUM CHLORIDE 0.9 % IV SOLN
1.0000 g | INTRAVENOUS | Status: DC
  Administered 2020-02-26: 1 g via INTRAVENOUS
  Filled 2020-02-26: qty 10

## 2020-02-26 MED ORDER — LACTATED RINGERS IV SOLN: INTRAVENOUS | Status: DC

## 2020-02-26 NOTE — ED Provider Notes (Signed)
[facility]-EMERGENCY DEPT Provider Note   CSN: [id number] Arrival date & time: 02/26/20  1937     History Chief Complaint  Patient presents with  . Fever    Jeremy Wu is a 59 y.o. male.  59 year old male with history of dementia presents due to temperature at the facility been 103 patient has not had any reported vomiting or diarrhea.  His dementia is noted to be unchanged.  EMS called and patient CBG was 149.  No medication given and patient transported here.        Past Medical History:  Diagnosis Date  . Anxiety   . Depression   . GERD (gastroesophageal reflux disease)   . HTN (hypertension)   . RBBB     Patient Active Problem List   Diagnosis Date Noted  . Intermittent lightheadedness 01/01/2020  . 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 Acute pancreatitis 11/29/2011  . Generalized anxiety disorder 11/29/2011    No past surgical history on file.     Family History  Problem Relation Age of Onset  . Heart Problems Maternal Aunt        Patient does not know further details    Social History   Tobacco Use  . Smoking status: Never Smoker  . Smokeless tobacco: Never Used  Substance Use Topics  . Alcohol use: No  . Drug use: No    Home Medications Prior to Admission medications   Medication Sig Start Date End Date Taking? Authorizing Provider  acetaminophen (TYLENOL) 325 MG tablet Take 650 mg by mouth every 6 (six) hours as needed for mild pain, moderate pain or fever.    [provider]  escitalopram (LEXAPRO) 10 MG tablet Take 1.5 tablets (15 mg total) by mouth daily. 01/06/20   Rhetta Mura, MD  famotidine (PEPCID) 10 MG tablet Take 10 mg by mouth 2 (two) times daily as needed for heartburn or indigestion.    [provider]  lactulose (CHRONULAC) 10 GM/15ML solution Take 30 mLs (20 g total) by mouth 2 (two) times daily. 01/06/20   Rhetta Mura, MD  oxybutynin (DITROPAN-XL) 10 MG 24 hr tablet Take 10 mg by mouth daily. 05/16/16   [provider]  potassium chloride SA (KLOR-CON) 20 MEQ tablet Take 2 tablets (40 mEq total) by mouth daily. 01/07/20   Rhetta Mura, MD  QUEtiapine (SEROQUEL) 100 MG tablet Take 100 mg by mouth 3 (three) times daily.  05/16/16   [provider]  vitamin B-12 (CYANOCOBALAMIN) 500 MCG tablet Take 500 mcg by mouth daily.    [provider]    Allergies    Sulfa antibiotics  Review of Systems   Review of Systems  Unable to perform ROS: Dementia    Physical Exam Updated Vital Signs BP (!) 104/51 (BP Location: Right Arm)   Pulse 87   Temp (!) 101.1 F (38.4 C) (Rectal)   Resp 18   SpO2 99%   Physical Exam Vitals and nursing note reviewed.  Constitutional:      General: He is not in acute distress.    Appearance: Normal appearance. He is well-developed. He is not toxic-appearing.  HENT:     Head: Normocephalic and atraumatic.  Eyes:     General: Lids are normal.     Conjunctiva/sclera: Conjunctivae normal.     Pupils: Pupils are equal, round, and reactive to light.  Neck:     Thyroid: No thyroid mass.  Trachea: No tracheal deviation.  Cardiovascular:     Rate and Rhythm: Normal rate and regular rhythm.     Heart sounds: Normal heart sounds. No murmur heard.  No gallop.   Pulmonary:     Effort: Pulmonary effort is normal. No respiratory distress.     Breath sounds: Normal breath sounds. No stridor. No decreased breath sounds, wheezing, rhonchi or rales.  Abdominal:     General: Bowel sounds are normal. There is no distension.     Palpations: Abdomen is soft.     Tenderness: There is no abdominal tenderness. There is no rebound.  Musculoskeletal:        General: No tenderness. Normal range of motion.     Cervical back: Normal range of motion and neck supple.  Skin:    General: Skin is warm and dry.     Findings: No abrasion or rash.    Neurological:     Mental Status: He is alert. He is disoriented.     GCS: GCS eye subscore is 4. GCS verbal subscore is 4. GCS motor subscore is 5.     Cranial Nerves: No cranial nerve deficit.     Sensory: No sensory deficit.     Comments: Patient withdraws to pain in all 4 extremities  Psychiatric:        Attention and Perception: He is inattentive.        Behavior: Behavior is withdrawn.     ED Results / Procedures / Treatments   Labs (all labs ordered are listed, but only abnormal results are displayed) Labs Reviewed  CULTURE, BLOOD (ROUTINE X 2)  CULTURE, BLOOD (ROUTINE X 2)  URINE CULTURE  SARS CORONAVIRUS 2 BY RT PCR (HOSPITAL ORDER, PERFORMED IN [place] [facility])  LACTIC ACID, PLASMA  LACTIC ACID, PLASMA  COMPREHENSIVE METABOLIC PANEL  CBC WITH DIFFERENTIAL/PLATELET  PROTIME-INR  APTT  URINALYSIS, ROUTINE W REFLEX MICROSCOPIC    EKG None  Radiology No results found.  Procedures Procedures (including critical care time)  Medications Ordered in ED Medications  lactated ringers infusion (has no administration in time range)  acetaminophen (TYLENOL) tablet 650 mg (has no administration in time range)    ED Course  I have reviewed the triage vital signs and the nursing notes.  Pertinent labs & imaging results that were available during my care of the patient were reviewed by me and considered in my medical decision making (see chart for details).    MDM Rules/Calculators/A&P                         Tylenol given for his fever. Patient given Rocephin for his UTI.  We placed on Keflex and discharged home Final Clinical Impression(s) / ED Diagnoses Final diagnoses:  None    Rx / DC Orders ED Discharge Orders    None       Lorre Nick, MD 02/26/20 2300

## 2020-02-26 NOTE — ED Triage Notes (Signed)
Patient sent here from [place] for chief complaint of fever. Rectal temp acquired here is 101.1. Patient with noted history of bipolar, depression, HTN, DM2, GERD, cognitive impairment. CBG 149 en route to ER.

## 2020-02-26 NOTE — ED Notes (Signed)
Unsuccessful attempts x 2 to obtain blood cultures. RN aware.

## 2020-02-26 NOTE — ED Notes (Signed)
IV team at bedside 

## 2020-02-28 LAB — URINE CULTURE: Culture: >=100000 — AB

## 2020-02-29 ENCOUNTER — Telehealth: Payer: Self-pay | Admitting: Emergency Medicine

## 2020-02-29 NOTE — Telephone Encounter (Signed)
Post ED Visit - Positive Culture Follow-up  Culture report reviewed by antimicrobial stewardship pharmacist: [facility] []  , Pharm.D. []  Enzo Bi, .D., BCPS AQ-ID []  Celedonio Miyamoto, Pharm.D., BCPS []  [address], Pharm.D., BCPS []  Gordon, Garvin Fila.D., BCPS, AAHIVP []  , Pharm.D., BCPS, AAHIVP []  Georgina Pillion, PharmD, BCPS []  , PharmD, BCPS []  [place], PharmD, BCPS []  [place], PharmD []  , PharmD, BCPS []  Estella Husk, PharmD  Pharmacy Team []  Lysle Pearl, PharmD []  , PharmD []  Phillips Climes, PharmD [x]  , Rph []  Agapito Games) , PharmD []  Verlan Friends, PharmD []  , PharmD []  Mervyn Gay, PharmD []  , PharmD []  Vinnie Level, PharmD []  W[facility], PharmD []  , PharmD []  Len Childs, PharmD   Positive urine culture Treated with Cephalexin, organism sensitive to the same and no further patient follow-up is required at this time.  Carin Shipp 02/29/2020, 3:45 PM

## 2020-03-02 LAB — CULTURE, BLOOD (ROUTINE X 2): Culture: NO GROWTH

## 2020-08-01 ENCOUNTER — Emergency Department (HOSPITAL_COMMUNITY)
Admission: EM | Admit: 2020-08-01 | Discharge: 2020-08-01 | Disposition: A | Discharge status: Home / Self Care | Payer: BC Managed Care – PPO | Attending: Emergency Medicine | Admitting: Emergency Medicine

## 2020-08-01 ENCOUNTER — Emergency Department (HOSPITAL_COMMUNITY): Payer: BC Managed Care – PPO

## 2020-08-01 ENCOUNTER — Other Ambulatory Visit: Payer: Self-pay

## 2020-08-01 DIAGNOSIS — I10 Essential (primary) hypertension: Secondary | ICD-10-CM | POA: Diagnosis not present

## 2020-08-01 DIAGNOSIS — Z20822 Contact with and (suspected) exposure to covid-19: Secondary | ICD-10-CM | POA: Insufficient documentation

## 2020-08-01 DIAGNOSIS — R531 Weakness: Secondary | ICD-10-CM | POA: Diagnosis not present

## 2020-08-01 LAB — COMPREHENSIVE METABOLIC PANEL
ALT: 9 U/L (ref 0–44)
AST: 12 U/L — ABNORMAL LOW (ref 15–41)
Albumin: 4.3 g/dL (ref 3.5–5.0)
Alkaline Phosphatase: 53 U/L (ref 38–126)
Anion gap: 8 (ref 5–15)
BUN: 27 mg/dL — ABNORMAL HIGH (ref 6–20)
CO2: 28 mmol/L (ref 22–32)
Calcium: 9.4 mg/dL (ref 8.9–10.3)
Chloride: 110 mmol/L (ref 98–111)
Creatinine, Ser: 0.96 mg/dL (ref 0.61–1.24)
GFR, Estimated: >60 mL/min (ref >60)
Glucose, Bld: 86 mg/dL (ref 70–99)
Potassium: 4.2 mmol/L (ref 3.5–5.1)
Sodium: 146 mmol/L — ABNORMAL HIGH (ref 135–145)
Total Bilirubin: 0.7 mg/dL (ref 0.3–1.2)
Total Protein: 6.8 g/dL (ref 6.5–8.1)

## 2020-08-01 LAB — DIFFERENTIAL
Abs Immature Granulocytes: 0 10*3/uL (ref 0.00–0.07)
Basophils Absolute: 0 10*3/uL (ref 0.0–0.1)
Basophils Relative: 1 %
Eosinophils Absolute: 0.1 10*3/uL (ref 0.0–0.5)
Eosinophils Relative: 2 %
Immature Granulocytes: 0 %
Lymphocytes Relative: 34 %
Lymphs Abs: 1.4 10*3/uL (ref 0.7–4.0)
Monocytes Absolute: 0.3 10*3/uL (ref 0.1–1.0)
Monocytes Relative: 8 %
Neutro Abs: 2.3 10*3/uL (ref 1.7–7.7)
Neutrophils Relative %: 55 %

## 2020-08-01 LAB — URINALYSIS, ROUTINE W REFLEX MICROSCOPIC
Bilirubin Urine: NEGATIVE
Glucose, UA: NEGATIVE mg/dL
Hgb urine dipstick: NEGATIVE
Ketones, ur: NEGATIVE mg/dL
Leukocytes,Ua: NEGATIVE
Nitrite: NEGATIVE
Protein, ur: NEGATIVE mg/dL
Specific Gravity, Urine: 1.02 (ref 1.005–1.030)
pH: 6 (ref 5.0–8.0)

## 2020-08-01 LAB — CBC
HCT: 39.6 % (ref 39.0–52.0)
Hemoglobin: 13.3 g/dL (ref 13.0–17.0)
MCH: 31.5 pg (ref 26.0–34.0)
MCHC: 33.6 g/dL (ref 30.0–36.0)
MCV: 93.8 fL (ref 80.0–100.0)
Platelets: 140 10*3/uL — ABNORMAL LOW (ref 150–400)
RBC: 4.22 MIL/uL (ref 4.22–5.81)
RDW: 13 % (ref 11.5–15.5)
WBC: 4.2 10*3/uL (ref 4.0–10.5)
nRBC: 0 % (ref 0.0–0.2)

## 2020-08-01 LAB — PROTIME-INR
INR: 1.1 (ref 0.8–1.2)
Prothrombin Time: 13.8 seconds (ref 11.4–15.2)

## 2020-08-01 LAB — APTT: aPTT: 28 seconds (ref 24–36)

## 2020-08-01 LAB — SARS CORONAVIRUS 2 BY RT PCR (HOSPITAL ORDER, PERFORMED IN ~~LOC~~ HOSPITAL LAB): SARS Coronavirus 2: NEGATIVE

## 2020-08-01 MED ORDER — SODIUM CHLORIDE 0.9 % IV BOLUS
500.0000 mL | Freq: Once | INTRAVENOUS | Status: AC
  Administered 2020-08-01: 500 mL via INTRAVENOUS

## 2020-08-01 MED ORDER — SODIUM CHLORIDE 0.9 % IV SOLN
100.0000 mL/h | INTRAVENOUS | Status: DC
  Administered 2020-08-01: 100 mL/h via INTRAVENOUS

## 2020-08-01 NOTE — Discharge Instructions (Addendum)
Continue your current medications.  Make sure to drink fluids and stay hydrated.  Return to the ED for recurrent or worsening symptoms

## 2020-08-01 NOTE — ED Triage Notes (Signed)
EMS reports from [facility], general weakness since this morning, normally ambulatory, needed assist to bathroom this morning. Pt denies pain. Hx of cognitive impairment and Diabetes.  BP 121/71 HR 58 RR 20 Sp02 98 RA CBG 98 Temp 98.2  20ga L hand

## 2020-08-01 NOTE — ED Provider Notes (Signed)
[facility] Provider Note   CSN: [id number] Arrival date & time: 08/01/20  5643     History Chief complaint: Weakness  [facility] is a 60 y.o. male.  HPI    [facility] a resident of [facility].  Patient's normally is able to get up and walk without difficulty.  Patient states this morning he just felt very weak.  Patient's not able to elaborate any further and just states he could not get up.  Cannot tell me whether it was more his arms or legs were all over.  He denies that he was having any pain.  He denies having any trouble with fevers or chills.  He denies coughing or shortness of breath.  He denies of abdominal pain vomiting or diarrhea.  He denies any trouble with his vision or speech.  Denies any difficulty with urination.  He has not noticed blood in his stool or dark black stools  Past Medical History:  Diagnosis Date  . Anxiety   . Depression   . GERD (gastroesophageal reflux disease)   . HTN (hypertension)   . RBBB     Patient Active Problem List   Diagnosis Date Noted  . Intermittent lightheadedness 01/01/2020  . MDD (major depressive disorder), recurrent severe, without psychosis ([name]) 06/10/2016  . Acute pancreatitis 11/29/2011  . Generalized anxiety disorder 11/29/2011    No past surgical history on file.     Family History  Problem Relation Age of Onset  . Heart Problems Maternal Aunt        Patient does not know further details    Social History   Tobacco Use  . Smoking status: Never Smoker  . Smokeless tobacco: Never Used  Substance Use Topics  . Alcohol use: No  . Drug use: No    Home Medications Prior to Admission medications   Medication Sig Start Date End Date Taking? Authorizing Provider  acetaminophen (TYLENOL) 325 MG tablet Take 650 mg by mouth every 6 (six) hours as needed for mild pain, moderate pain or fever.   Yes [provider]  Blood Pressure KIT 1 each by Does not  apply route every 30 (thirty) days.   Yes [provider]  Cimetidine (HEARTBURN RELIEF PO) Take 1 tablet by mouth 2 (two) times daily as needed (heartburn / indegestion).   Yes [provider]  diazepam (VALIUM) 5 MG tablet Take 5 mg by mouth in the morning and at bedtime. 02/18/20  Yes [provider]  escitalopram (LEXAPRO) 10 MG tablet Take 1.5 tablets (15 mg total) by mouth daily. 01/06/20  Yes [name], MD  glucose blood test strip 1 each by Other route every 7 (seven) days. Use as instructed   Yes [provider]  loperamide (IMODIUM) 2 MG capsule Take 2 mg by mouth 4 (four) times daily as needed for diarrhea or loose stools.   Yes [provider]  oxybutynin (DITROPAN-XL) 10 MG 24 hr tablet Take 10 mg by mouth daily. 05/16/16  Yes [provider]  potassium chloride SA (KLOR-CON) 20 MEQ tablet Take 2 tablets (40 mEq total) by mouth daily. 01/07/20  Yes [name], MD  QUEtiapine (SEROQUEL) 100 MG tablet Take 100 mg by mouth 3 (three) times daily.  05/16/16  Yes [provider]  vitamin B-12 (CYANOCOBALAMIN) 500 MCG tablet Take 500 mcg by mouth daily.   Yes [provider]  cephALEXin (KEFLEX) 500 MG capsule Take 1 capsule (500 mg total)  by mouth 3 (three) times daily. Patient not taking: Reported on 08/01/2020 02/26/20   Lacretia Leigh, MD  Cholecalciferol (VITAMIN D) 50 MCG (2000 UT) CAPS Take 1 capsule by mouth daily. Patient not taking: Reported on 08/01/2020    [provider]  famotidine (PEPCID) 10 MG tablet Take 10 mg by mouth 2 (two) times daily as needed for heartburn or indigestion. Patient not taking: Reported on 08/01/2020    [provider]  lactulose (CHRONULAC) 10 GM/15ML solution Take 30 mLs (20 g total) by mouth 2 (two) times daily. Patient not taking: Reported on 08/01/2020 01/06/20   [name], MD    Allergies    Sulfa antibiotics  Review of Systems    Review of Systems  All other systems reviewed and are negative.   Physical Exam Updated Vital Signs BP 122/79   Pulse (!) 58   Temp 97.8 F (36.6 C) (Oral)   Resp 14   SpO2 100%   Physical Exam Vitals and nursing note reviewed.  Constitutional:      General: He is not in acute distress.    Appearance: He is well-developed and well-nourished.     Comments: Frail, underweight  HENT:     Head: Normocephalic and atraumatic.     Right Ear: External ear normal.     Left Ear: External ear normal.     Mouth/Throat:     Mouth: Oropharynx is clear and moist.  Eyes:     General: No scleral icterus.       Right eye: No discharge.        Left eye: No discharge.     Conjunctiva/sclera: Conjunctivae normal.  Neck:     Trachea: No tracheal deviation.  Cardiovascular:     Rate and Rhythm: Normal rate and regular rhythm.     Pulses: Intact distal pulses.  Pulmonary:     Effort: Pulmonary effort is normal. No respiratory distress.     Breath sounds: Normal breath sounds. No stridor. No wheezing or rales.  Abdominal:     General: Bowel sounds are normal. There is no distension.     Palpations: Abdomen is soft.     Tenderness: There is no abdominal tenderness. There is no guarding or rebound.  Musculoskeletal:        General: No tenderness or edema.     Cervical back: Neck supple.  Skin:    General: Skin is warm and dry.     Findings: No rash.  Neurological:     Mental Status: He is alert and oriented to person, place, and time.     Cranial Nerves: No cranial nerve deficit (no facial droop, extraocular movements intact, no slurred speech).     Sensory: No sensory deficit.     Motor: Weakness present. No abnormal muscle tone or seizure activity.     Coordination: Coordination normal.     Deep Tendon Reflexes: Strength normal.     Comments: No pronator drift bilateral upper extrem, able to hold both legs off bed for 5 seconds, sensation intact in all extremities, no visual field  cuts, no left or right sided neglect, normal finger-nose exam bilaterally, no nystagmus noted, generalized weakness with grip strength bilaterally as well as leg strength bilaterally   Psychiatric:        Mood and Affect: Mood and affect normal.     ED Results / Procedures / Treatments   Labs (all labs ordered are listed, but only abnormal results are displayed) Labs Reviewed  CBC - Abnormal; Notable for the following components:      Result Value   Platelets 140 (*)    All other components within normal limits  COMPREHENSIVE METABOLIC PANEL - Abnormal; Notable for the following components:   Sodium 146 (*)    BUN 27 (*)    AST 12 (*)    All other components within normal limits  SARS CORONAVIRUS 2 BY RT PCR (HOSPITAL ORDER, [facility])  PROTIME-INR  APTT  DIFFERENTIAL  URINALYSIS, ROUTINE W REFLEX MICROSCOPIC    EKG EKG Interpretation  Date/Time:  Sunday August 01 2020 08:24:17 EST Ventricular Rate:  63 PR Interval:    QRS Duration: 166 QT Interval:  460 QTC Calculation: 471 R Axis:   99 Text Interpretation: Sinus rhythm Ventricular premature complex RBBB and LPFB No significant change since last tracing Confirmed by Dorie Rank [id number]) on 08/01/2020 8:49:03 AM   Radiology CT HEAD WO CONTRAST  Result Date: 08/01/2020 CLINICAL DATA:  Neurological deficit greater than 6 hours, stroke suspected EXAM: CT HEAD WITHOUT CONTRAST TECHNIQUE: Contiguous axial images were obtained from the base of the skull through the vertex without intravenous contrast. COMPARISON:  Prior CT scan of the head 01/01/2020 FINDINGS: Brain: No evidence of acute infarction, hemorrhage, hydrocephalus, extra-axial collection or mass lesion/mass effect. Similar appearance of significant cerebral and cerebellar cortical atrophy which is advanced for age. Mild to moderate periventricular and subcortical white matter hypoattenuation consistent with chronic microvascular ischemic white  matter disease. Relative hypoattenuation in the left temporal lobe favored to be streak artifact. Vascular: No hyperdense vessel or unexpected calcification. Skull: Normal. Negative for fracture or focal lesion. Hyperostosis frontalis interna. Sinuses/Orbits: No acute finding. Other: None. IMPRESSION: 1. No acute intracranial abnormality. 2. 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 Electronically Signed   By: Jacqulynn Cadet M.D.   On: 08/01/2020 09:11   DG Chest Portable 1 View  Result Date: 08/01/2020 CLINICAL DATA:  Weakness EXAM: PORTABLE CHEST 1 VIEW COMPARISON:  02/26/2020 FINDINGS: Normal heart size and mediastinal contours. No acute infiltrate or edema. No effusion or pneumothorax. No acute osseous findings. IMPRESSION: No active disease. Electronically Signed   By: Monte Fantasia M.D.   On: 08/01/2020 08:51    Procedures Procedures   Medications Ordered in ED Medications  sodium chloride 0.9 % bolus 500 mL (0 mLs Intravenous Stopped 08/01/20 0944)    Followed by  0.9 %  sodium chloride infusion (100 mL/hr Intravenous New Bag/Given 08/01/20 0945)    ED Course  I have reviewed the triage vital signs and the nursing notes.  Pertinent labs & imaging results that were available during my care of the patient were reviewed by me and considered in my medical decision making (see chart for details).  Clinical Course as of 08/01/20 1107  Sun Aug 01, 2020  2130 CT scan and chest x-ray unremarkable [JK]  0916 CBC normal [JK]  1004 Electrolytes show mild hyponatremia. [JK]  1004 Urinalysis without signs of infection. [QM]  5784 Patient was able to walk around the ED without difficulty. [JK]  44 Updated family member and they are comfortable with taking him home right now. [JK]    Clinical Course User Index [JK] Dorie Rank, MD   MDM Rules/Calculators/A&P                         History is somewhat difficult as patient's  not really  able to specify.  Does have history of cognitive disability per the EMS note.  DDX CNS etiology, metabolic derangement, acute infection, anemia.  Proceed with labs, xrays.  ED work-up is reassuring.  No signs of significant electrolyte abnormalities.  No dehydration.  No anemia.  No signs of infection.  Both covid and UA are normal.  CT scan was performed and there are no acute findings.  I doubt acute stroke or other acute CNS event.  Patient was given IV fluids.  He is now walking around without any difficulty.  Patient's family member states patient has some signs of anxiety and wonder if this might have been the issue.  At this point however no signs of any acute emergency medical condition the patient appears stable for discharge Final Clinical Impression(s) / ED Diagnoses Final diagnoses:  Weakness    Rx / DC Orders ED Discharge Orders    None       Dorie Rank, MD 08/01/20 1108

## 2020-08-01 NOTE — ED Notes (Signed)
Patient transported to CT 

## 2020-08-01 NOTE — ED Notes (Addendum)
Pt ambulated 50 feet without difficulty. Pt brother is at bedside and will transport pt back to facility.

## 2022-07-14 IMAGING — CT CT HEAD W/O CM
2 of 4 series · 12 of 47 positions shown, 15 images · non-contrast
Comparison: Prior CT scan of the head 01/01/2020

CLINICAL DATA: Neurological deficit greater than 6 hours, stroke
suspected

EXAM:
CT HEAD WITHOUT CONTRAST
TECHNIQUE: Contiguous axial images were obtained from the base of the skull
through the vertex without intravenous contrast.

[Series 5: coronal soft tissue · coronal · 0.32mm/px · 3 of 84 slices shown]
[im 28/84  brain]
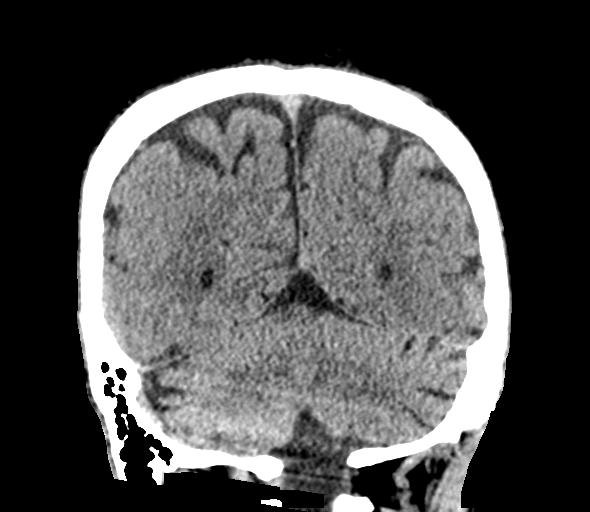
[im 37/84  brain]
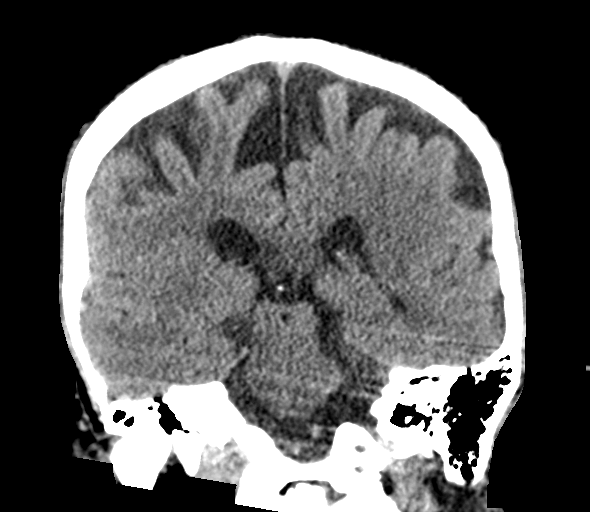
[im 47/84  brain]
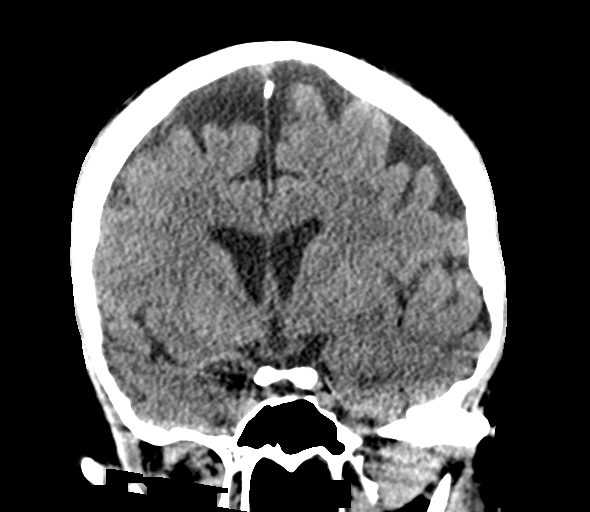

[Series 7: axial soft tissue · axial · 0.37mm/px · z∈[-166,-29]mm · 9 of 55 slices shown, 12 images]
[im 4/55  brain]
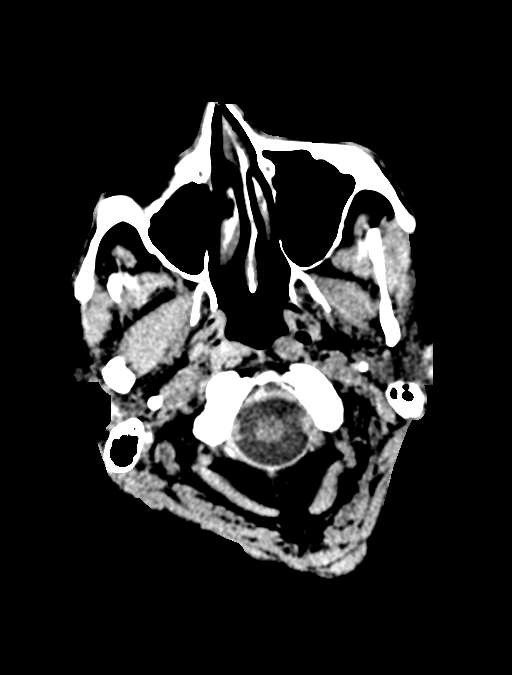
[im 4/55  bone]
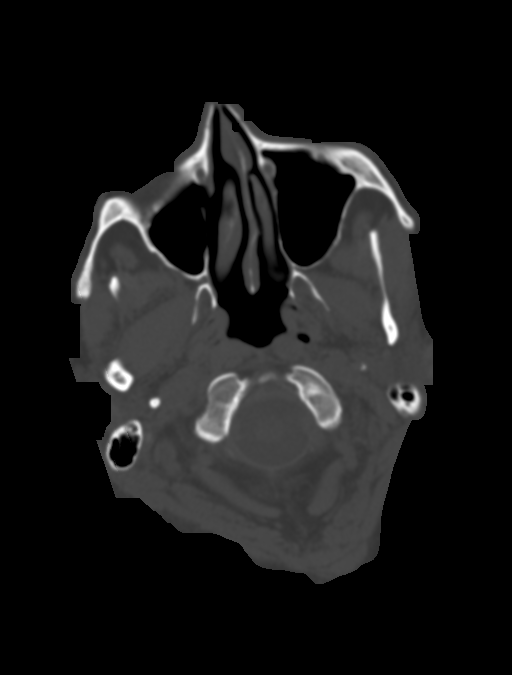
[im 10/55  brain]
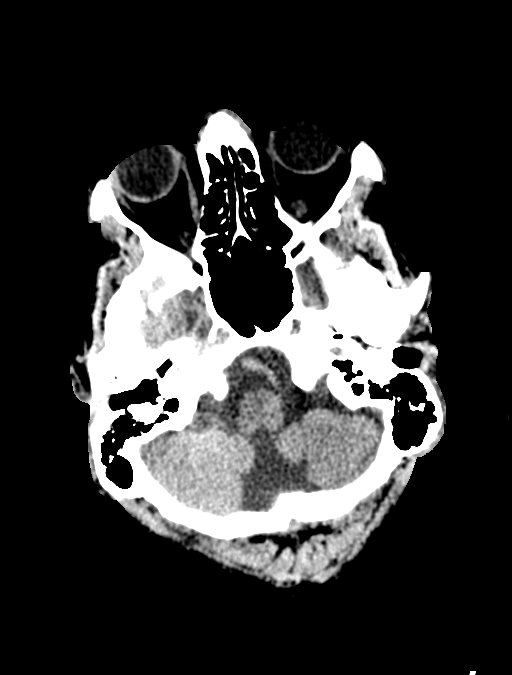
[im 16/55  brain]
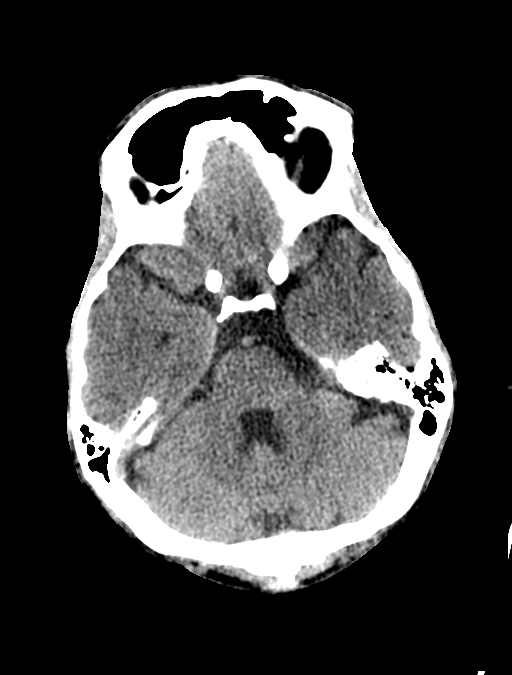
[im 22/55  brain]
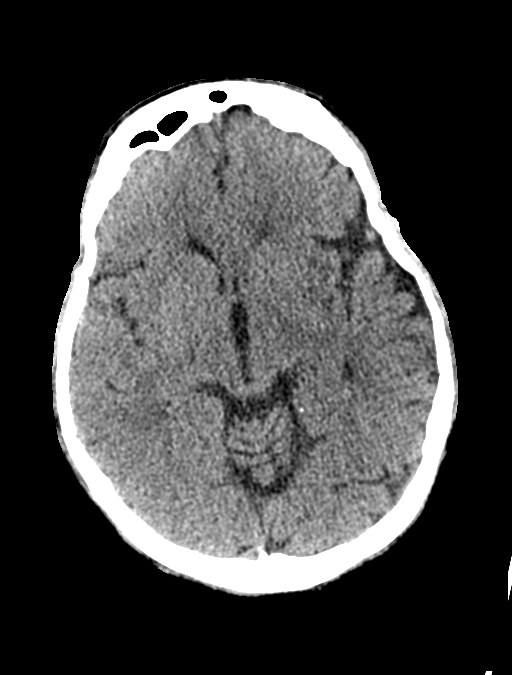
[im 28/55  brain]
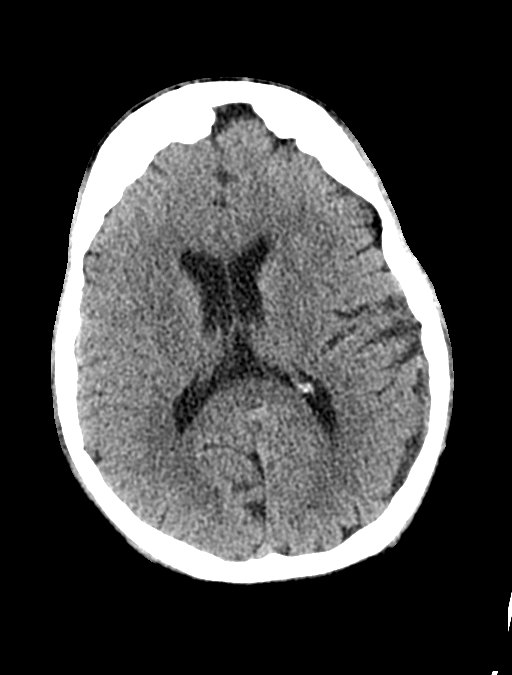
[im 28/55  bone]
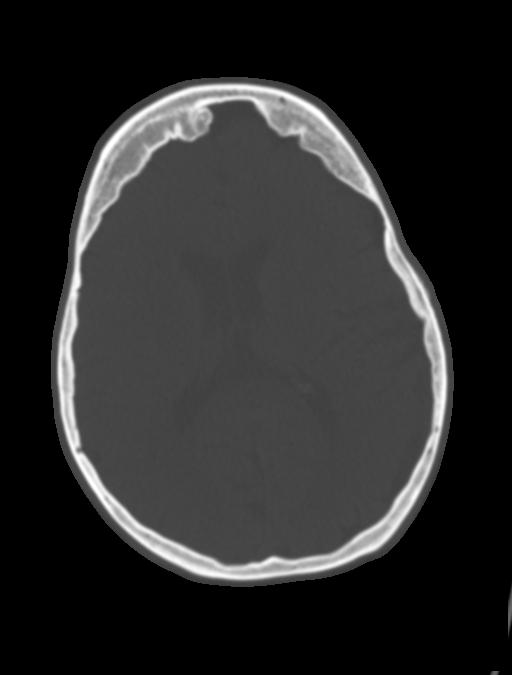
[im 34/55  brain]
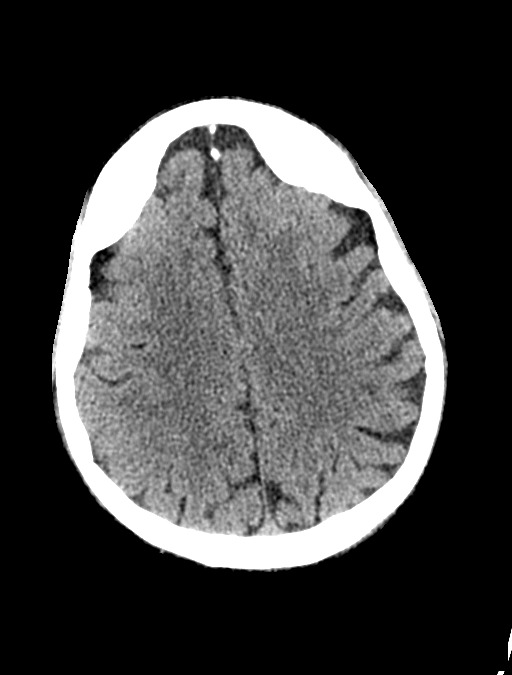
[im 40/55  brain]
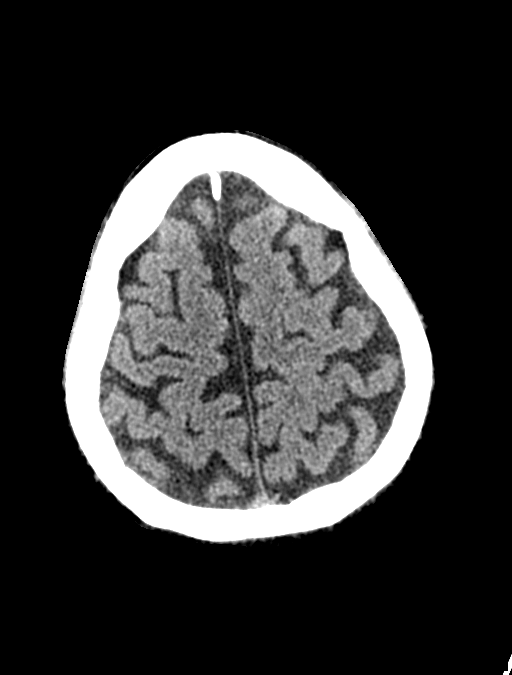
[im 46/55  brain]
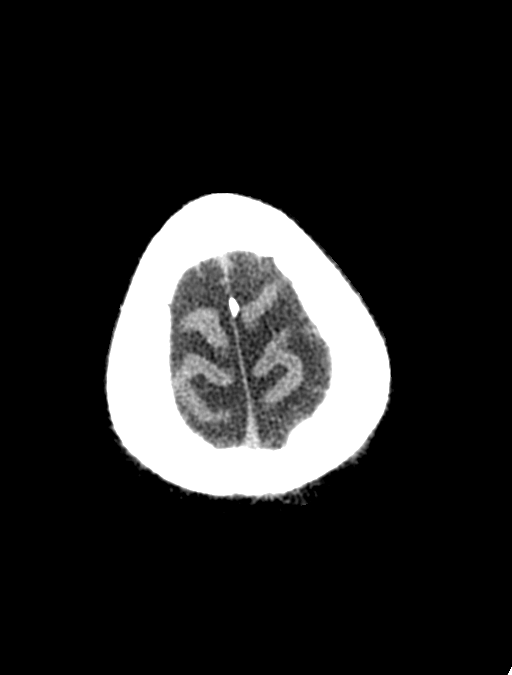
[im 52/55  brain]
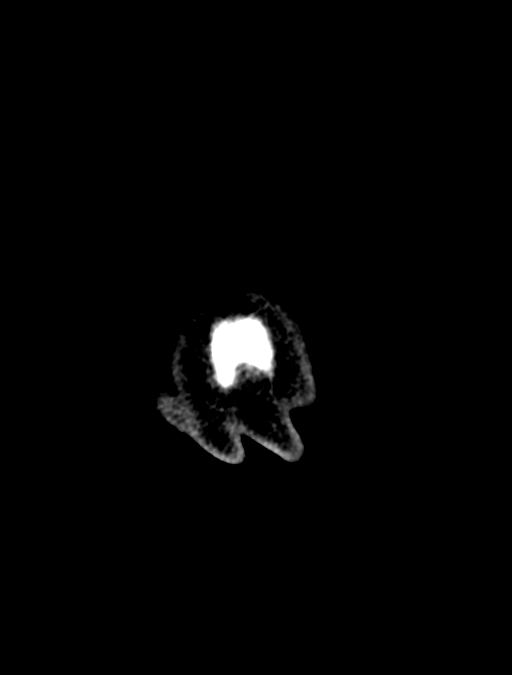
[im 52/55  bone]
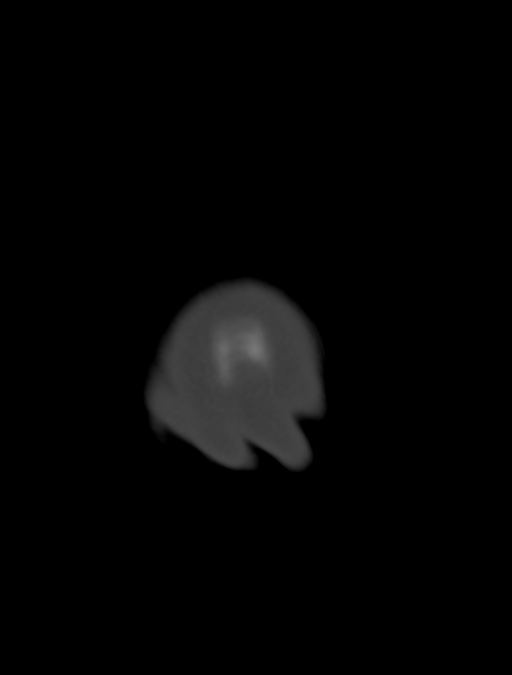

[12 of 47 positions shown; findings below may reference images not displayed]

FINDINGS: Brain: No evidence of acute infarction, hemorrhage, hydrocephalus,
extra-axial collection or mass lesion/mass effect. Similar
appearance of significant cerebral and cerebellar cortical atrophy
which is advanced for age. Mild to moderate periventricular and
subcortical white matter hypoattenuation consistent with chronic
microvascular ischemic white matter disease. Relative
hypoattenuation in the left temporal lobe favored to be streak
artifact.

Vascular: No hyperdense vessel or unexpected calcification.

Skull: Normal. Negative for fracture or focal lesion. Hyperostosis
frontalis interna.

Sinuses/Orbits: No acute finding.

Other: None.
IMPRESSION: 1. No acute intracranial abnormality.
2. Stable age advanced atrophy and mild to moderate chronic
microvascular ischemic white matter disease.

## 2022-07-14 IMAGING — DX DG CHEST 1V PORT
2 series · 2 of 2 positions shown · non-contrast
Comparison: 02/26/2020

CLINICAL DATA: Weakness

EXAM:
PORTABLE CHEST 1 VIEW

[chest ap (1 of 2)]
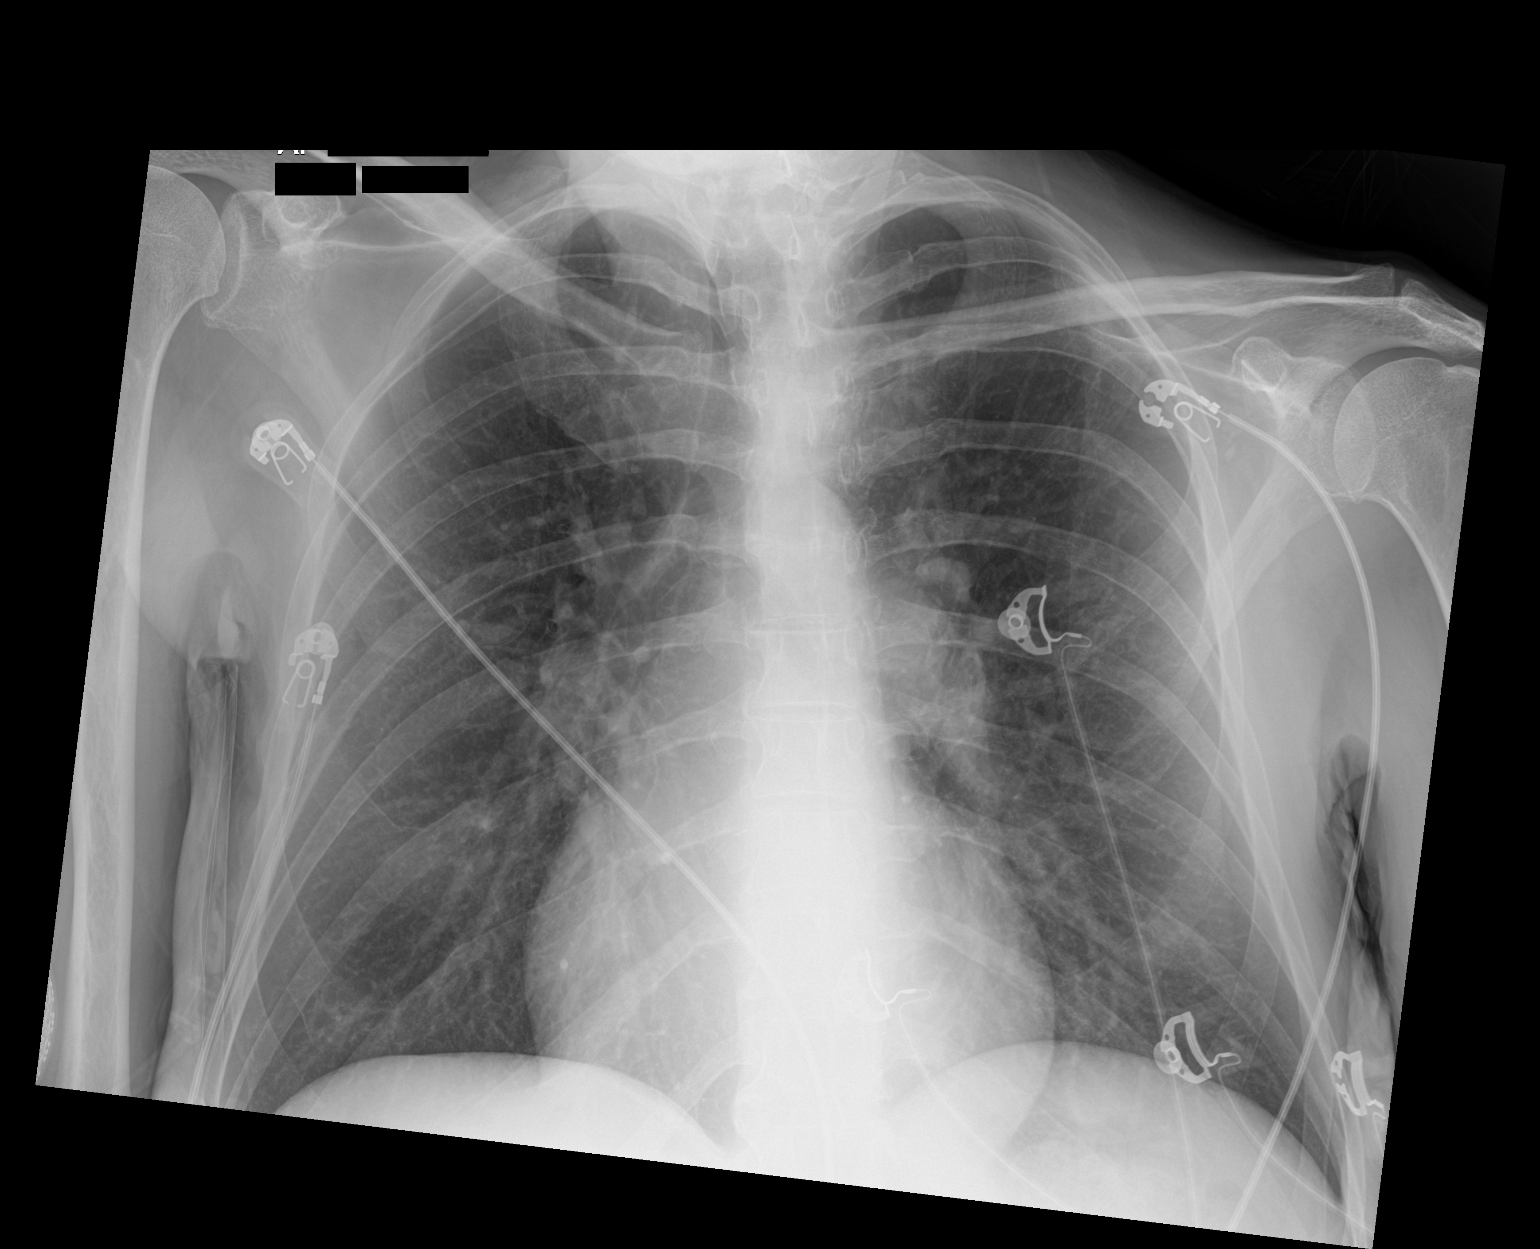

[chest ap (2 of 2)]
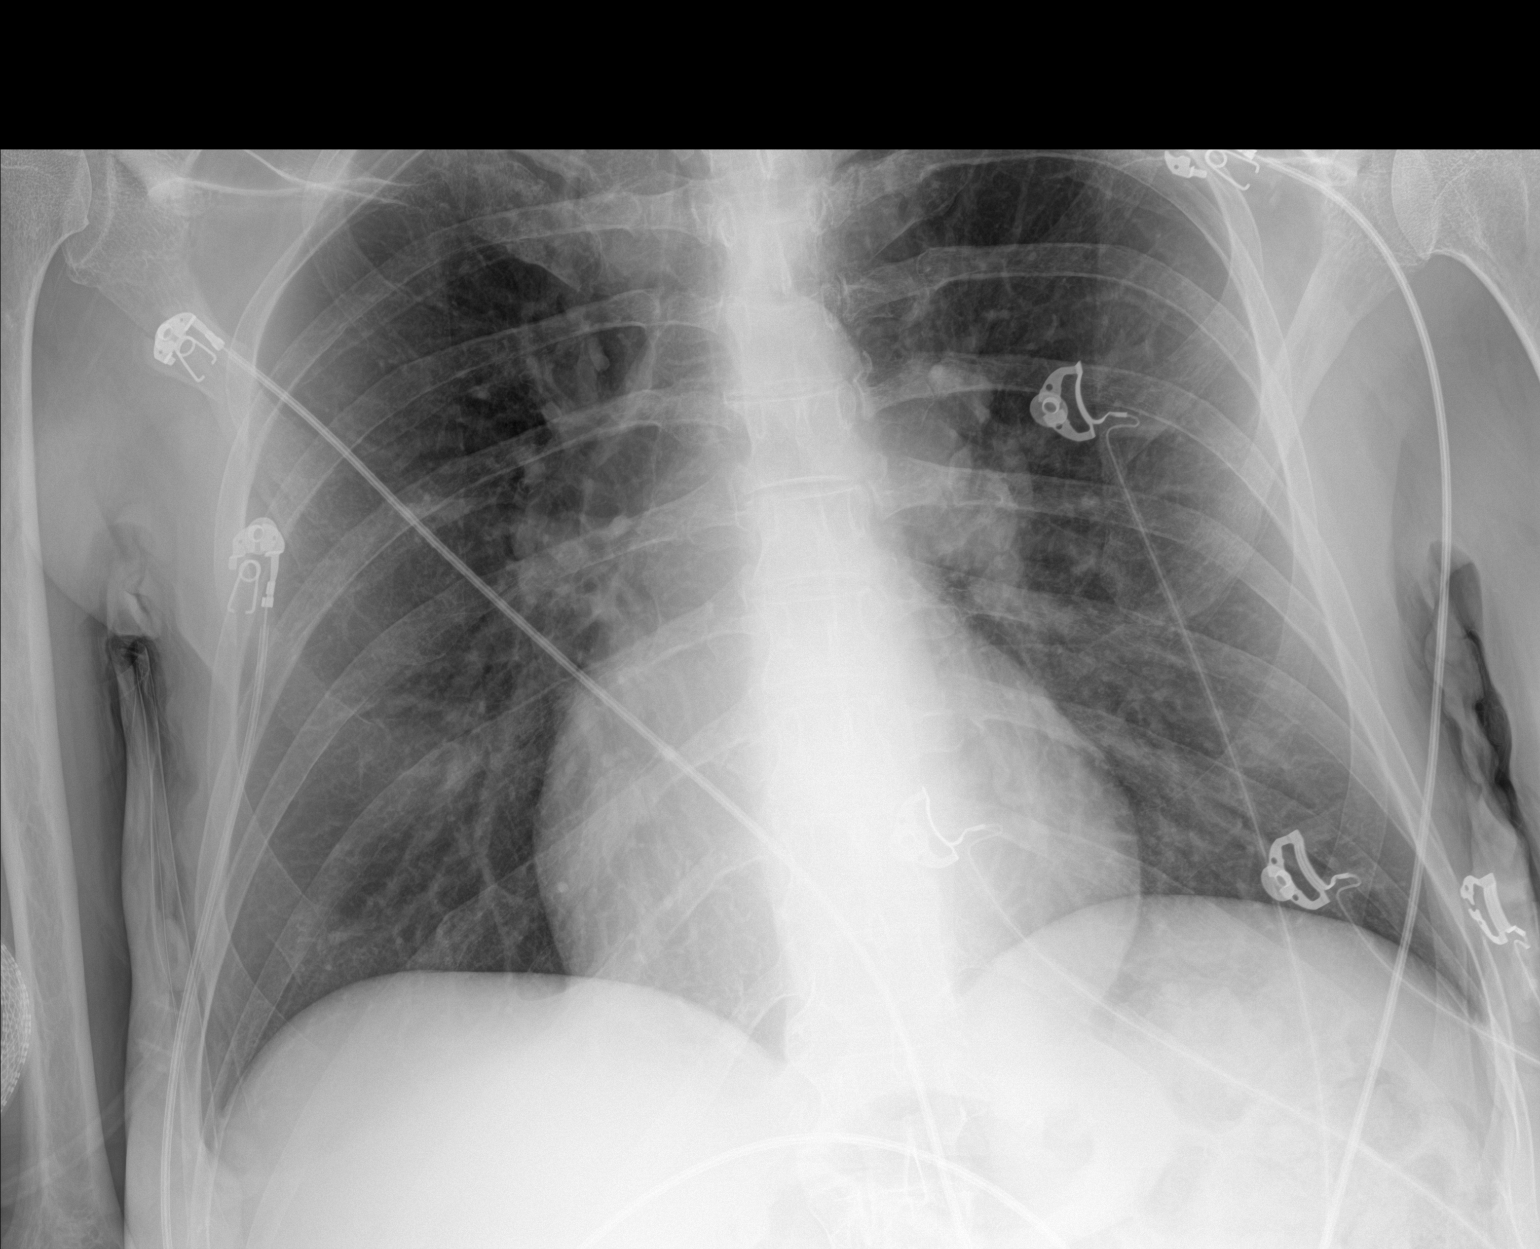

[2 of 2 positions shown; findings below may reference images not displayed]

FINDINGS: Normal heart size and mediastinal contours. No acute infiltrate or
edema. No effusion or pneumothorax. No acute osseous findings.
IMPRESSION: No active disease.

## 2023-09-22 ENCOUNTER — Other Ambulatory Visit: Payer: Self-pay

## 2023-09-22 ENCOUNTER — Emergency Department (HOSPITAL_COMMUNITY)

## 2023-09-22 ENCOUNTER — Emergency Department (HOSPITAL_COMMUNITY)
Admission: EM | Admit: 2023-09-22 | Discharge: 2023-09-23 | Disposition: A | Discharge status: Home / Self Care | Attending: Emergency Medicine | Admitting: Emergency Medicine

## 2023-09-22 DIAGNOSIS — U071 COVID-19: Secondary | ICD-10-CM | POA: Diagnosis not present

## 2023-09-22 DIAGNOSIS — R410 Disorientation, unspecified: Secondary | ICD-10-CM | POA: Insufficient documentation

## 2023-09-22 DIAGNOSIS — R509 Fever, unspecified: Secondary | ICD-10-CM | POA: Diagnosis present

## 2023-09-22 DIAGNOSIS — R569 Unspecified convulsions: Secondary | ICD-10-CM | POA: Diagnosis not present

## 2023-09-22 LAB — URINALYSIS, ROUTINE W REFLEX MICROSCOPIC
Bilirubin Urine: NEGATIVE
Glucose, UA: NEGATIVE mg/dL
Hgb urine dipstick: NEGATIVE
Ketones, ur: NEGATIVE mg/dL
Leukocytes,Ua: NEGATIVE
Nitrite: NEGATIVE
Protein, ur: NEGATIVE mg/dL
Specific Gravity, Urine: >1.03 — ABNORMAL HIGH (ref 1.005–1.030)
pH: 5.5 (ref 5.0–8.0)

## 2023-09-22 LAB — CBC
HCT: 44.4 % (ref 39.0–52.0)
Hemoglobin: 14.9 g/dL (ref 13.0–17.0)
MCH: 31.5 pg (ref 26.0–34.0)
MCHC: 33.6 g/dL (ref 30.0–36.0)
MCV: 93.9 fL (ref 80.0–100.0)
Platelets: 143 10*3/uL — ABNORMAL LOW (ref 150–400)
RBC: 4.73 MIL/uL (ref 4.22–5.81)
RDW: 13.4 % (ref 11.5–15.5)
WBC: 5.7 10*3/uL (ref 4.0–10.5)
nRBC: 0 % (ref 0.0–0.2)

## 2023-09-22 LAB — COMPREHENSIVE METABOLIC PANEL
ALT: 13 U/L (ref 0–44)
AST: 22 U/L (ref 15–41)
Albumin: 3.7 g/dL (ref 3.5–5.0)
Alkaline Phosphatase: 70 U/L (ref 38–126)
Anion gap: 7 (ref 5–15)
BUN: 18 mg/dL (ref 8–23)
CO2: 26 mmol/L (ref 22–32)
Calcium: 8.9 mg/dL (ref 8.9–10.3)
Chloride: 105 mmol/L (ref 98–111)
Creatinine, Ser: 1.02 mg/dL (ref 0.61–1.24)
GFR, Estimated: >60 mL/min (ref >60)
Glucose, Bld: 108 mg/dL — ABNORMAL HIGH (ref 70–99)
Potassium: 3.8 mmol/L (ref 3.5–5.1)
Sodium: 138 mmol/L (ref 135–145)
Total Bilirubin: 1 mg/dL (ref 0.0–1.2)
Total Protein: 6.2 g/dL — ABNORMAL LOW (ref 6.5–8.1)

## 2023-09-22 LAB — I-STAT CG4 LACTIC ACID, ED: Lactic Acid, Venous: 1.5 mmol/L (ref 0.5–1.9)

## 2023-09-22 LAB — VALPROIC ACID LEVEL: Valproic Acid Lvl: <10 ug/mL — ABNORMAL LOW (ref 50.0–100.0)

## 2023-09-22 LAB — RESP PANEL BY RT-PCR (RSV, FLU A&B, COVID)  RVPGX2
Influenza A by PCR: NEGATIVE
Influenza B by PCR: NEGATIVE
Resp Syncytial Virus by PCR: NEGATIVE
SARS Coronavirus 2 by RT PCR: POSITIVE — AB

## 2023-09-22 LAB — CBG MONITORING, ED: Glucose-Capillary: 104 mg/dL — ABNORMAL HIGH (ref 70–99)

## 2023-09-22 LAB — LIPASE, BLOOD: Lipase: 27 U/L (ref 11–51)

## 2023-09-22 MED ORDER — ACETAMINOPHEN 325 MG PO TABS
650.0000 mg | ORAL_TABLET | Freq: Once | ORAL | Status: AC
  Administered 2023-09-22: 650 mg via ORAL
  Filled 2023-09-22: qty 2

## 2023-09-22 NOTE — Discharge Instructions (Signed)
 This patient was found to have COVID-19 infection, it is difficult to say whether he had a seizure, he did not have any clinical evidence or lab evidence of a seizure in the emergency department, if he has any suspected seizure activity going forward it may be worthwhile to follow-up with outpatient neurology, at this time I think it is stable for him to return to the facility as long as he is using ibuprofen, Tylenol to manage any fever, he can continue to take his diazepam for seizure prophylaxis.

## 2023-09-22 NOTE — ED Notes (Signed)
 This RN attempted to obtain second set of blood cultures twice, was unsuccessful. Per PA Prosperi, ok to only send one set.

## 2023-09-22 NOTE — ED Notes (Signed)
 This RN spoke with patient's brother Ed at this time. Brother agrees with plan to have [facility] transport pt back to facility.

## 2023-09-22 NOTE — ED Triage Notes (Signed)
 Pt BIB EMS from [facility] at [facility] for suspected seizure. Pt was found by facility staff sitting on the toilet after a shower, staff reported the pt was shaking and his eyes were bulging. Staff reports that pt was not responding normally after this episode and wasn't acting himself, staff also report that the patient had a similar episode earlier in the day. Per EMS, pt with cognitive deficits at baseline and has a hx of seizures. VSS with EMS, EKG showed right bundle branch block, unsure if this is pts baseline. Pt confused during triage.

## 2023-09-22 NOTE — ED Notes (Signed)
 [facility] called

## 2023-09-22 NOTE — ED Provider Notes (Signed)
 [place] [facility] DEPARTMENT AT [facility] Provider Note   CSN: [id number] Arrival date & time: 09/22/23  2009     History  Chief Complaint  Patient presents with   Seizures    Jeremy Wu is a 63 y.o. male past medical history seen for depression, anxiety, intellectual delay, seizure disorder who has historically taken Depakote although his current medication list reports only diazepam 3 times daily who presents concern for witnessed seizure-like activity with tonic-clonic activity and bulging eyes.  He states that his skilled nursing facility.  He is alert and oriented to self but somewhat confused about time, place, situation, somewhat slow responses per EMS.  Right bundle branch block on EKG and route, but this is patient's previous known rhythm.   Seizures      Home Medications Prior to Admission medications   Medication Sig Start Date End Date Taking? Authorizing Provider  acetaminophen (TYLENOL) 325 MG tablet Take 650 mg by mouth every 6 (six) hours as needed for mild pain, moderate pain or fever.    [provider]  Blood Pressure KIT 1 each by Does not apply route every 30 (thirty) days.    [provider]  cephALEXin (KEFLEX) 500 MG capsule Take 1 capsule (500 mg total) by mouth 3 (three) times daily. Patient not taking: Reported on 08/01/2020 02/26/20   Lorre Nick, MD  Cholecalciferol (VITAMIN D) 50 MCG (2000 UT) CAPS Take 1 capsule by mouth daily. Patient not taking: Reported on 08/01/2020    [provider]  Cimetidine (HEARTBURN RELIEF PO) Take 1 tablet by mouth 2 (two) times daily as needed (heartburn / indegestion).    [provider]  diazepam (VALIUM) 5 MG tablet Take 5 mg by mouth in the morning and at bedtime. 02/18/20   [provider]  escitalopram (LEXAPRO) 10 MG tablet Take 1.5 tablets (15 mg total) by mouth daily. 01/06/20   Rhetta Mura, MD  famotidine (PEPCID) 10 MG tablet Take 10 mg  by mouth 2 (two) times daily as needed for heartburn or indigestion. Patient not taking: Reported on 08/01/2020    [provider]  glucose blood test strip 1 each by Other route every 7 (seven) days. Use as instructed    [provider]  lactulose (CHRONULAC) 10 GM/15ML solution Take 30 mLs (20 g total) by mouth 2 (two) times daily. Patient not taking: Reported on 08/01/2020 01/06/20   Rhetta Mura, MD  loperamide (IMODIUM) 2 MG capsule Take 2 mg by mouth 4 (four) times daily as needed for diarrhea or loose stools.    [provider]  oxybutynin (DITROPAN-XL) 10 MG 24 hr tablet Take 10 mg by mouth daily. 05/16/16   [provider]  potassium chloride SA (KLOR-CON) 20 MEQ tablet Take 2 tablets (40 mEq total) by mouth daily. 01/07/20   Rhetta Mura, MD  QUEtiapine (SEROQUEL) 100 MG tablet Take 100 mg by mouth 3 (three) times daily.  05/16/16   [provider]  vitamin B-12 (CYANOCOBALAMIN) 500 MCG tablet Take 500 mcg by mouth daily.    [provider]      Allergies    Sulfa antibiotics    Review of Systems   Review of Systems  Neurological:  Positive for seizures.  All other systems reviewed and are negative.   Physical Exam Updated Vital Signs BP 113/78   Pulse 85   Temp 100.3 F (37.9 C) (Oral)   Resp 20   SpO2 100%  Physical Exam  Vitals and nursing note reviewed.  Constitutional:      General: He is not in acute distress.    Appearance: Normal appearance.  HENT:     Head: Normocephalic and atraumatic.  Eyes:     General:        Right eye: No discharge.        Left eye: No discharge.  Cardiovascular:     Rate and Rhythm: Normal rate and regular rhythm.     Heart sounds: No murmur heard.    No friction rub. No gallop.  Pulmonary:     Effort: Pulmonary effort is normal.     Breath sounds: Normal breath sounds.  Abdominal:     General: Bowel sounds are normal.     Palpations: Abdomen is soft.  Skin:     General: Skin is warm and dry.     Capillary Refill: Capillary refill takes less than 2 seconds.  Neurological:     Mental Status: He is alert. He is disoriented.     Comments: Moving all four limbs sponatenously  Psychiatric:        Mood and Affect: Mood normal.        Behavior: Behavior normal.     ED Results / Procedures / Treatments   Labs (all labs ordered are listed, but only abnormal results are displayed) Labs Reviewed  RESP PANEL BY RT-PCR (RSV, FLU A&B, COVID)  RVPGX2 - Abnormal; Notable for the following components:      Result Value   SARS Coronavirus 2 by RT PCR POSITIVE (*)    All other components within normal limits  VALPROIC ACID LEVEL - Abnormal; Notable for the following components:   Valproic Acid Lvl <10 (*)    All other components within normal limits  CBC - Abnormal; Notable for the following components:   Platelets 143 (*)    All other components within normal limits  COMPREHENSIVE METABOLIC PANEL - Abnormal; Notable for the following components:   Glucose, Bld 108 (*)    Total Protein 6.2 (*)    All other components within normal limits  CBG MONITORING, ED - Abnormal; Notable for the following components:   Glucose-Capillary 104 (*)    All other components within normal limits  CULTURE, BLOOD (ROUTINE X 2)  CULTURE, BLOOD (ROUTINE X 2)  LIPASE, BLOOD  URINALYSIS, ROUTINE W REFLEX MICROSCOPIC  I-STAT CG4 LACTIC ACID, ED    EKG EKG Interpretation Date/Time:  Saturday September 22 2023 20:13:41 EDT Ventricular Rate:  85 PR Interval:  152 QRS Duration:  164 QT Interval:  388 QTC Calculation: 462 R Axis:   113  Text Interpretation: Sinus rhythm RBBB and LPFB No significant change since last tracing Confirmed by Jacalyn Lefevre [id number]) on 09/22/2023 8:18:25 PM  Radiology CT Head Wo Contrast Result Date: 09/22/2023 CLINICAL DATA:  Head trauma, moderate to severe.  Seizures. EXAM: CT HEAD WITHOUT CONTRAST TECHNIQUE: Contiguous axial images were  obtained from the base of the skull through the vertex without intravenous contrast. RADIATION DOSE REDUCTION: This exam was performed according to the departmental dose-optimization program which includes automated exposure control, adjustment of the mA and/or kV according to patient size and/or use of iterative reconstruction technique. COMPARISON:  08/01/2020 FINDINGS: Brain: No evidence of acute infarction, hemorrhage, hydrocephalus, extra-axial collection or mass lesion/mass effect. Mild cerebral atrophy. Patchy low-attenuation changes in the deep white matter likely representing small vessel ischemic changes. No change since prior study. Vascular: No hyperdense vessel or unexpected calcification. Skull: Normal. Negative  for fracture or focal lesion. Sinuses/Orbits: Mucosal thickening in the paranasal sinuses. No acute air-fluid levels. Mastoid air cells are clear. Other: None. IMPRESSION: No acute intracranial abnormalities. Mild chronic atrophy and small vessel ischemic changes similar to prior study. Electronically Signed   By: Burman Nieves M.D.   On: 09/22/2023 22:48   DG Chest Portable 1 View Result Date: 09/22/2023 CLINICAL DATA:  Sepsis EXAM: PORTABLE CHEST 1 VIEW COMPARISON:  08/01/2020 FINDINGS: Cardiac shadow is enlarged but stable. Lungs are well aerated bilaterally. No focal infiltrate is seen. Mild central vascular congestion is noted without significant edema. No bony abnormality is seen. IMPRESSION: Mild central vascular congestion without edema. Electronically Signed   By: Alcide Clever M.D.   On: 09/22/2023 21:19    Procedures Procedures    Medications Ordered in ED Medications  acetaminophen (TYLENOL) tablet 650 mg (650 mg Oral Given 09/22/23 2237)    ED Course/ Medical Decision Making/ A&P                                 Medical Decision Making Amount and/or Complexity of Data Reviewed Labs: ordered. Radiology: ordered.   This patient is a 63 y.o. male  who presents  to the ED for concern of seizure, new fever   Differential diagnoses prior to evaluation: The emergent differential diagnosis includes, but is not limited to, given his known history of seizure disorder most suspicious either for missed medication dose or current infectious ideology that lowered his seizure threshold, considered upper respiratory infection, pneumonia, UTI, intra-abdominal infection, versus other. This is not an exhaustive differential.   Past Medical History / Co-morbidities / Social History: depression, anxiety, intellectual delay, seizure disorder  Additional history: Chart reviewed. Pertinent results include: Reviewed lab work, imaging from previous emergency department visits, notably he has been on Depakote previously even though he does not have any seizure medication currently documented.  Physical Exam: Physical exam performed. The pertinent findings include: Somewhat confused, slow to respond, he is able to tell us his name, he is moving his arms and legs but cannot follow commands, no facial droop noted  Lab Tests/Imaging studies: I personally interpreted labs/imaging and the pertinent results include: CBC unremarkable other than very mild thrombocytopenia, platelets 143, his RVP is positive for COVID, his CMP is unremarkable, his lipase is normal his initial lactic acid is normal, his blood cultures are pending but overall with very low clinical suspicion that he has a bacterial infection, suspect that the COVID infection lowered his seizure threshold.  I independently interpreted plain film chest x-ray shows some central vascular congestion without overt pulmonary edema or pleural effusion.  I do feel interpreted CT head without contrast which shows no evidence of acute intracranial abnormality or other etiology for his acute seizure in context of known seizure history.  I agree with the radiologist interpretation.  His valproic acid level is undetectable, but I question  true seizure activity, I think he is reasonable to stay on his diazepam at this time.  Cardiac monitoring: EKG obtained and interpreted by myself and attending physician which shows: Normal sinus rhythm, right bundle branch block, no acute change from last tracing   Medications: I ordered medication including Tylenol for fever.  I have reviewed the patients home medicines and have made adjustments as needed.   Disposition: After consideration of the diagnostic results and the patients response to treatment, I feel that patient for discharge stable  back to facility.   emergency department workup does not suggest an emergent condition requiring admission or immediate intervention beyond what has been performed at this time. The plan is: as above. The patient is safe for discharge and has been instructed to return immediately for worsening symptoms, change in symptoms or any other concerns.  Final Clinical Impression(s) / ED Diagnoses Final diagnoses:  COVID  Seizure-like activity [facility])    Rx / DC Orders ED Discharge Orders     None         [place] 09/22/23 2252    Jacalyn Lefevre, MD 09/23/23 [phone]

## 2023-09-23 NOTE — ED Notes (Signed)
 Patient transported to back to facility via [facility]. All belongings sent with patient.

## 2023-09-27 LAB — CULTURE, BLOOD (ROUTINE X 2): Culture: NO GROWTH
